# Patient Record
Sex: Female | Born: 1950 | Race: White | Hispanic: No | Marital: Married | Smoking: Former smoker
Health system: Southern US, Academic
[De-identification: ages and names within clinical notes are randomized; demographics above are authoritative.]

## PROBLEM LIST (undated history)

## (undated) DIAGNOSIS — K219 Gastro-esophageal reflux disease without esophagitis: Secondary | ICD-10-CM

## (undated) HISTORY — DX: Gastro-esophageal reflux disease without esophagitis: K21.9

## (undated) HISTORY — PX: HX HYSTERECTOMY: SHX81

## (undated) HISTORY — PX: PARTIAL GASTRECTOMY: SHX2172

---

## 1982-10-29 ENCOUNTER — Other Ambulatory Visit (HOSPITAL_COMMUNITY): Payer: Self-pay | Admitting: INTERNAL MEDICINE

## 2005-07-05 DIAGNOSIS — J8409 Other alveolar and parieto-alveolar conditions: Secondary | ICD-10-CM | POA: Insufficient documentation

## 2005-07-05 HISTORY — DX: Other alveolar and parieto-alveolar conditions: J84.09

## 2015-05-29 DIAGNOSIS — F32A Depression, unspecified: Secondary | ICD-10-CM | POA: Insufficient documentation

## 2015-05-29 DIAGNOSIS — IMO0002 Reserved for concepts with insufficient information to code with codable children: Secondary | ICD-10-CM

## 2015-05-29 DIAGNOSIS — G894 Chronic pain syndrome: Secondary | ICD-10-CM | POA: Insufficient documentation

## 2015-05-29 DIAGNOSIS — I10 Essential (primary) hypertension: Secondary | ICD-10-CM

## 2015-05-29 DIAGNOSIS — F411 Generalized anxiety disorder: Secondary | ICD-10-CM | POA: Insufficient documentation

## 2015-05-29 DIAGNOSIS — K219 Gastro-esophageal reflux disease without esophagitis: Secondary | ICD-10-CM

## 2015-05-29 DIAGNOSIS — M810 Age-related osteoporosis without current pathological fracture: Secondary | ICD-10-CM | POA: Insufficient documentation

## 2015-05-29 HISTORY — DX: Hypocalcemia: E83.51

## 2015-05-29 HISTORY — DX: Essential (primary) hypertension: I10

## 2015-05-29 HISTORY — DX: Generalized anxiety disorder: F41.1

## 2015-05-29 HISTORY — DX: Gastro-esophageal reflux disease without esophagitis: K21.9

## 2015-05-29 HISTORY — DX: Depression, unspecified: F32.A

## 2015-05-29 HISTORY — DX: Chronic pain syndrome: G89.4

## 2015-05-29 HISTORY — DX: Age-related osteoporosis without current pathological fracture: M81.0

## 2015-05-29 HISTORY — DX: Reserved for concepts with insufficient information to code with codable children: IMO0002

## 2015-09-18 DIAGNOSIS — J441 Chronic obstructive pulmonary disease with (acute) exacerbation: Secondary | ICD-10-CM | POA: Insufficient documentation

## 2015-09-18 DIAGNOSIS — J449 Chronic obstructive pulmonary disease, unspecified: Secondary | ICD-10-CM | POA: Insufficient documentation

## 2015-09-18 HISTORY — DX: Chronic obstructive pulmonary disease with (acute) exacerbation: J44.1

## 2016-11-25 DIAGNOSIS — E162 Hypoglycemia, unspecified: Secondary | ICD-10-CM

## 2016-11-25 HISTORY — DX: Hypoglycemia, unspecified: E16.2

## 2018-06-29 DIAGNOSIS — Z79891 Long term (current) use of opiate analgesic: Secondary | ICD-10-CM | POA: Insufficient documentation

## 2018-06-29 DIAGNOSIS — M62830 Muscle spasm of back: Secondary | ICD-10-CM | POA: Insufficient documentation

## 2018-06-29 DIAGNOSIS — M47814 Spondylosis without myelopathy or radiculopathy, thoracic region: Secondary | ICD-10-CM | POA: Insufficient documentation

## 2018-06-29 DIAGNOSIS — M792 Neuralgia and neuritis, unspecified: Secondary | ICD-10-CM

## 2018-06-29 DIAGNOSIS — M7918 Myalgia, other site: Secondary | ICD-10-CM | POA: Insufficient documentation

## 2018-06-29 HISTORY — DX: Muscle spasm of back: M62.830

## 2018-06-29 HISTORY — DX: Neuralgia and neuritis, unspecified: M79.2

## 2018-06-29 HISTORY — DX: Spondylosis without myelopathy or radiculopathy, thoracic region: M47.814

## 2018-06-29 HISTORY — DX: Long term (current) use of opiate analgesic: Z79.891

## 2018-06-29 HISTORY — DX: Myalgia, other site: M79.18

## 2020-08-07 IMAGING — MR MRI BRAIN W/O CONTRAST
8 of 9 series · 36 of 48 positions shown · non-contrast
Comparison: None previous.

﻿EXAM:  MRI BRAIN W/O CONTRAST
INDICATION: 70-year-old with gait disturbance.  Dementia.  Hypertension.
TECHNIQUE: Axial, coronal and sagittal images without contrast including diffusion-weighted sequence, T2* gradient echo sequence, FLAIR sequence and T1 and T2 sequences.

[Series 5: DWI · axial · 5.0mm · 1.35mm/px · z∈[-43,+77]mm · 10 of 88 slices shown (1 of 3)]
[im 6/88]
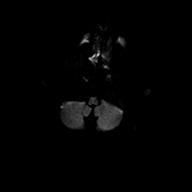
[im 12/88]
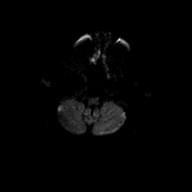
[im 18/88]
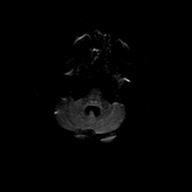
[im 30/88]
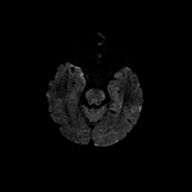
[im 41/88]
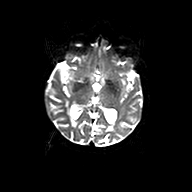
[im 47/88]
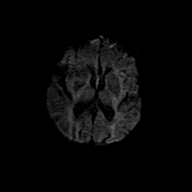
[im 53/88]
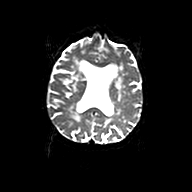
[im 64/88]
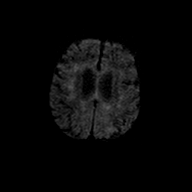
[im 76/88]
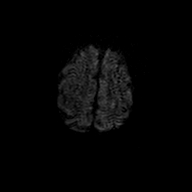
[im 88/88]
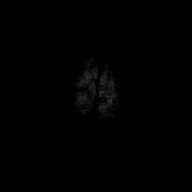

[Series 6: DWI · axial · 5.0mm · 1.35mm/px · z∈[-49,+77]mm · 4 of 22 slices shown (2 of 3)]
[im 1/22]
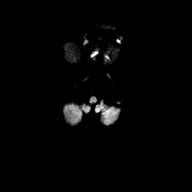
[im 8/22]
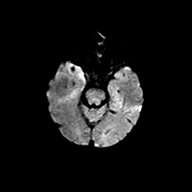
[im 15/22]
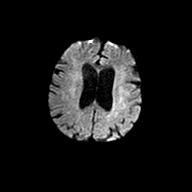
[im 22/22]
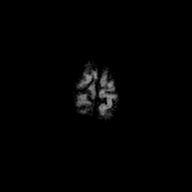

[Series 7: DWI · axial · 5.0mm · 1.35mm/px · z∈[-49,+77]mm · 4 of 22 slices shown (3 of 3)]
[im 1/22]
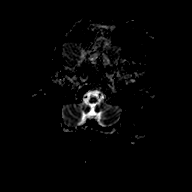
[im 8/22]
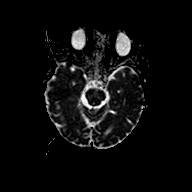
[im 15/22]
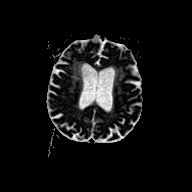
[im 22/22]
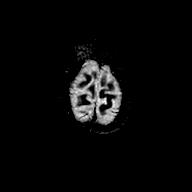

[Series 8: FLAIR · sagittal · 4.0mm · 0.75mm/px · 4 of 26 slices shown (1 of 2)]
[im 1/26]
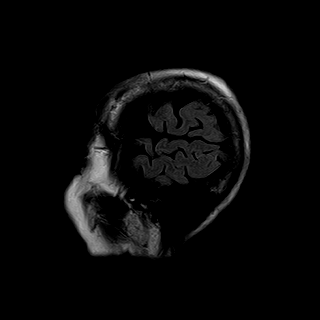
[im 9/26]
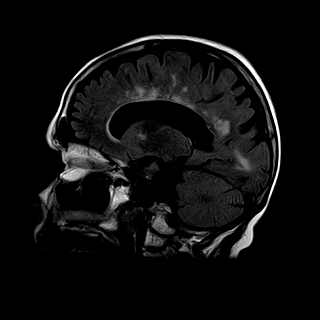
[im 17/26]
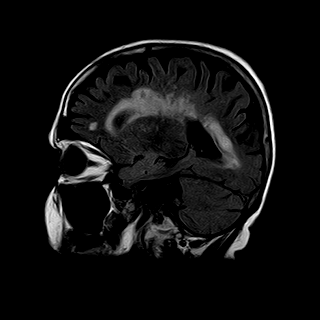
[im 26/26]
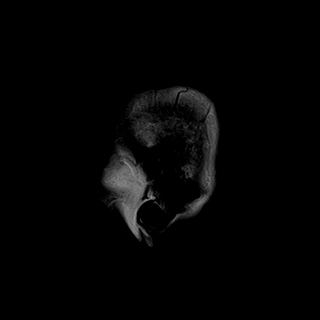

[Series 9: T2 · axial · 5.0mm · 0.43mm/px · z∈[-50,+106]mm · 4 of 27 slices shown (1 of 2)]
[im 1/27]
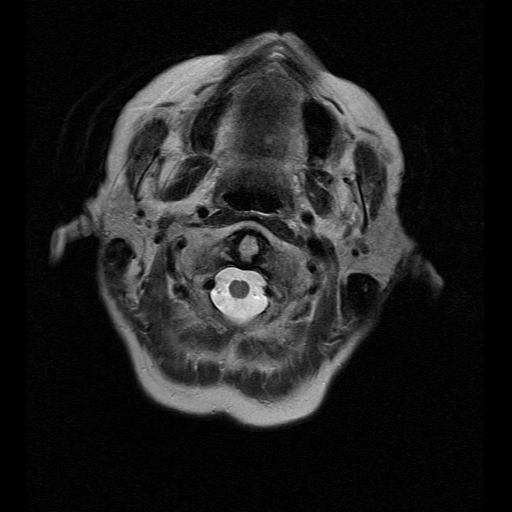
[im 9/27]
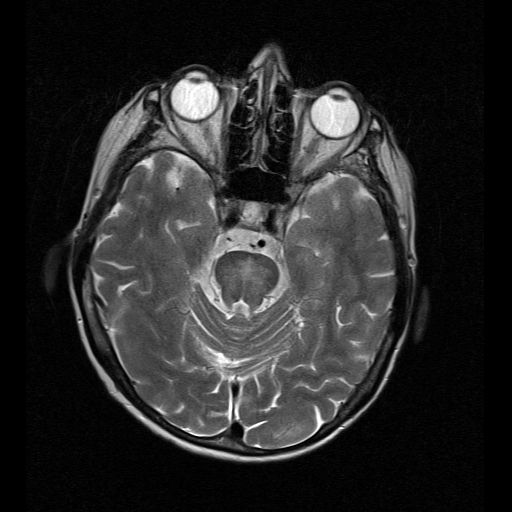
[im 18/27]
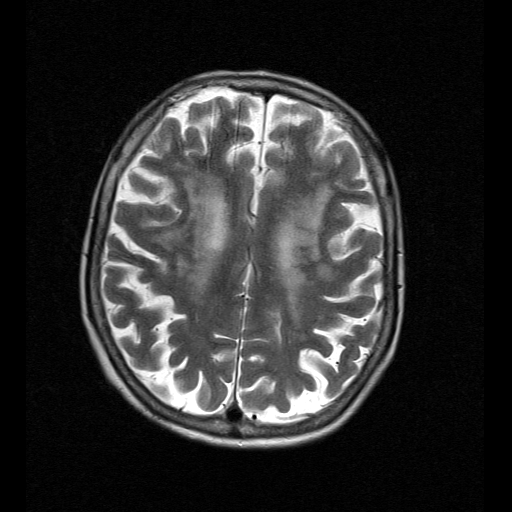
[im 27/27]
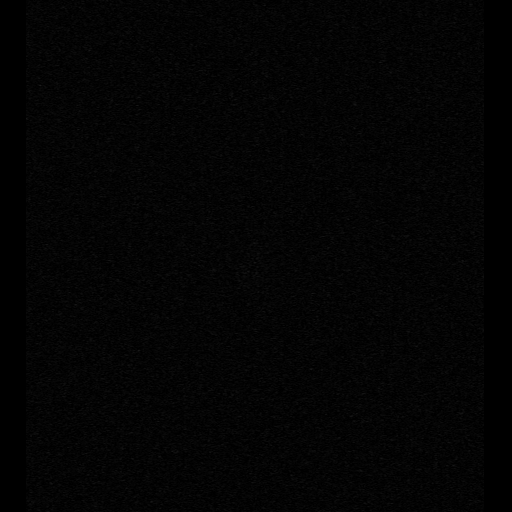

[Series 10: FLAIR · axial · 5.0mm · 0.43mm/px · z∈[-50,+106]mm · 4 of 27 slices shown (2 of 2)]
[im 1/27]
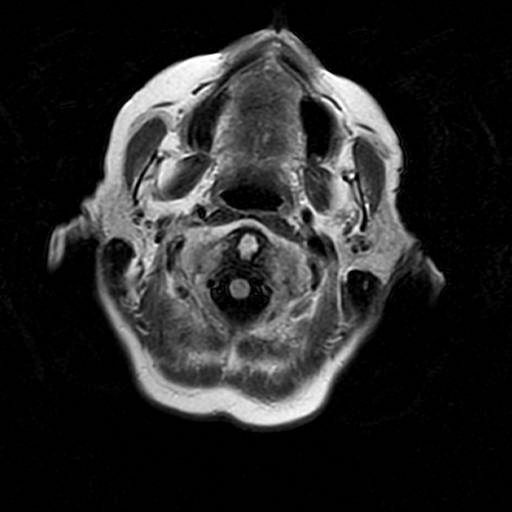
[im 9/27]
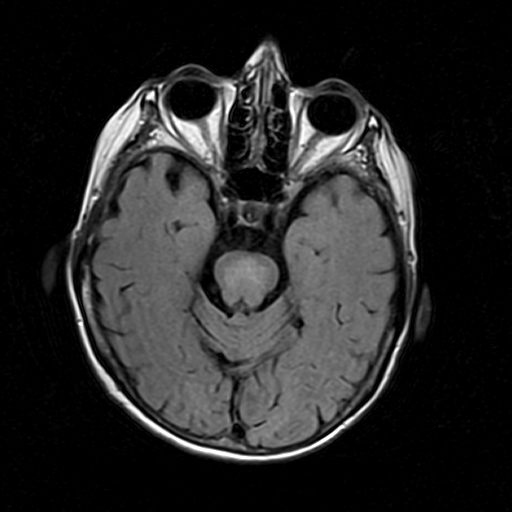
[im 18/27]
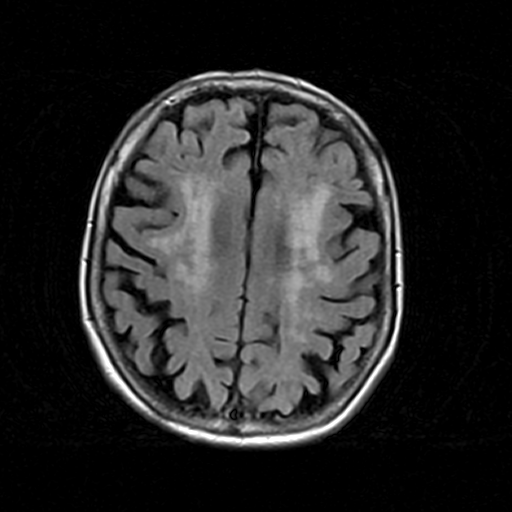
[im 27/27]
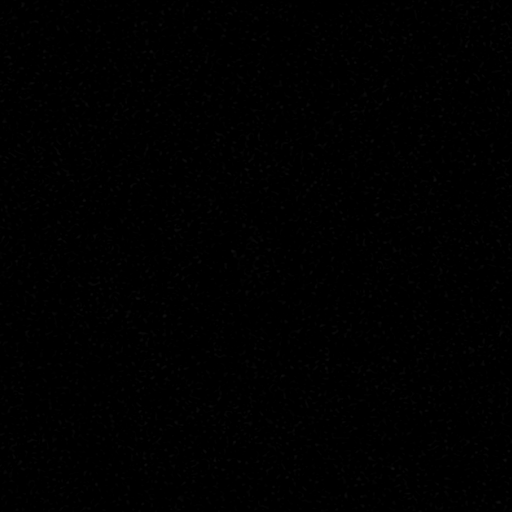

[Series 12: T1 · axial · 5.0mm · 0.43mm/px · z∈[-50,-2]mm · 2 of 27 slices shown]
[im 1/27]
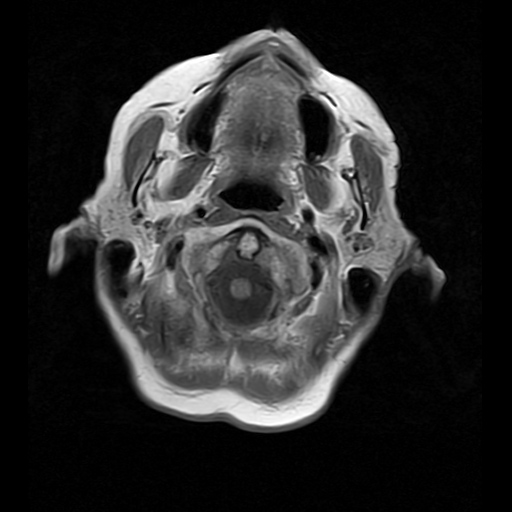
[im 9/27]
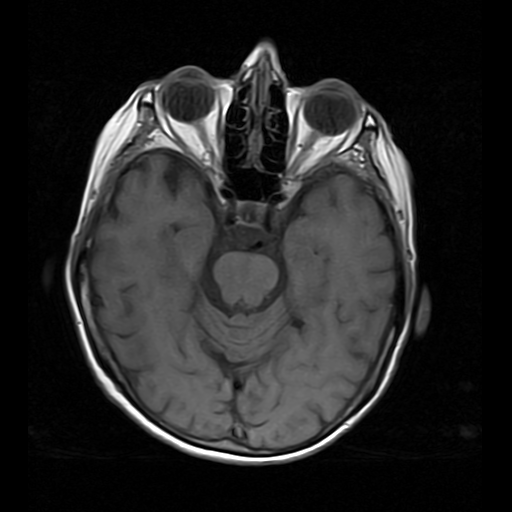

[Series 13: T2 · coronal · 6.0mm · 0.43mm/px · 4 of 24 slices shown (2 of 2)]
[im 1/24]
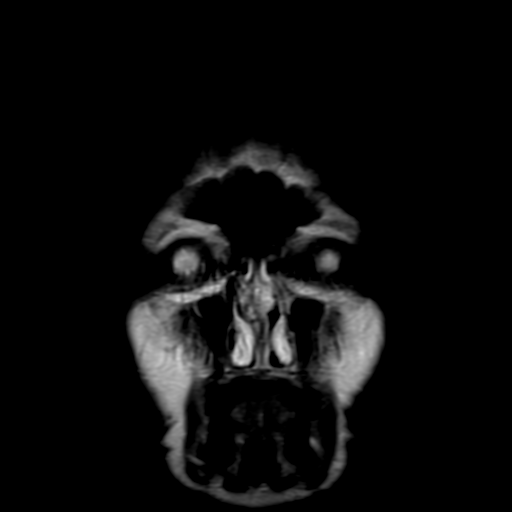
[im 8/24]
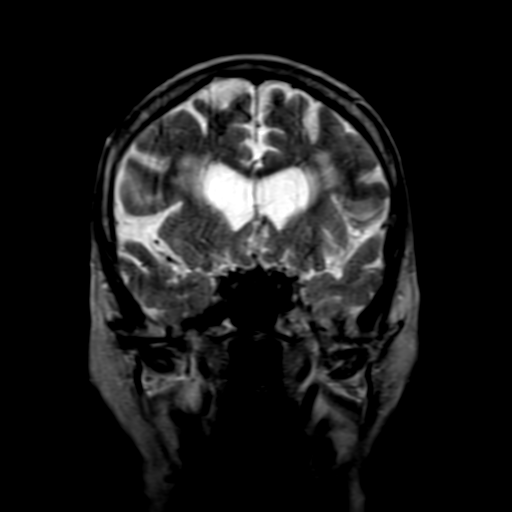
[im 16/24]
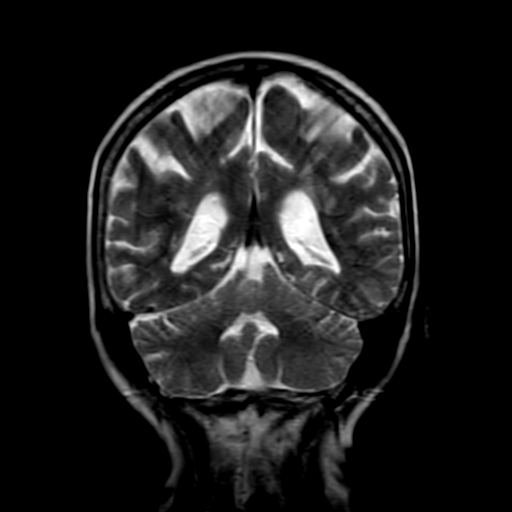
[im 24/24]
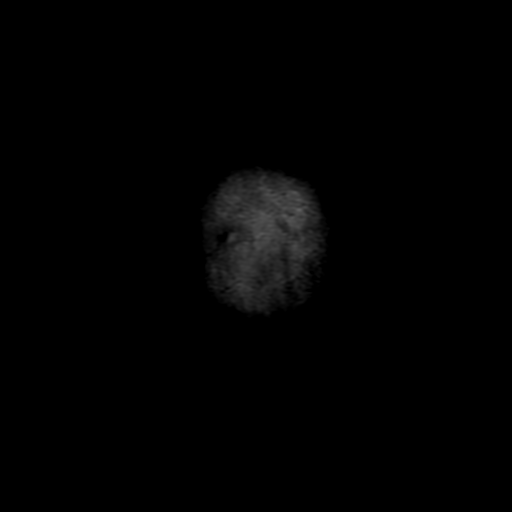

[36 of 48 positions shown; findings below may reference images not displayed]

FINDINGS: No evidence of acute ischemic process on the diffusion sequence.  Chronic small vessel ischemic change of periventricular white matter on both sides with increased FLAIR signal are noted.  Moderate, symmetric global cerebral atrophy is noted.  No ventriculomegaly or midline shift.  

Major arteries of circle of Willis are patent.  Dural venous sinuses are patent.
IMPRESSION: 1. No evidence of acute ischemia or intracranial bleed.  

2. No space occupying lesions, ventriculomegaly or midline shift.  

3. Significant chronic small vessel ischemic change of periventricular white matter.  Moderate symmetric global cerebral cortical atrophy.  

4. Major arteries of circle of Willis and dural venous sinuses are patent.

## 2021-02-01 DIAGNOSIS — S72001A Fracture of unspecified part of neck of right femur, initial encounter for closed fracture: Secondary | ICD-10-CM | POA: Insufficient documentation

## 2021-02-01 HISTORY — DX: Fracture of unspecified part of neck of right femur, initial encounter for closed fracture: S72.001A

## 2021-02-07 DIAGNOSIS — D62 Acute posthemorrhagic anemia: Secondary | ICD-10-CM | POA: Insufficient documentation

## 2021-02-07 DIAGNOSIS — Z5189 Encounter for other specified aftercare: Secondary | ICD-10-CM | POA: Insufficient documentation

## 2021-02-07 DIAGNOSIS — D500 Iron deficiency anemia secondary to blood loss (chronic): Secondary | ICD-10-CM | POA: Insufficient documentation

## 2021-02-08 DIAGNOSIS — G2581 Restless legs syndrome: Secondary | ICD-10-CM | POA: Insufficient documentation

## 2021-06-22 ENCOUNTER — Other Ambulatory Visit: Payer: Medicare Other | Attending: PHYSICIAN/UNDEFINED PHYSICIAN TYPE | Admitting: PHYSICIAN/UNDEFINED PHYSICIAN TYPE

## 2021-06-22 DIAGNOSIS — L820 Inflamed seborrheic keratosis: Secondary | ICD-10-CM | POA: Insufficient documentation

## 2021-06-22 DIAGNOSIS — C44712 Basal cell carcinoma of skin of right lower limb, including hip: Secondary | ICD-10-CM | POA: Insufficient documentation

## 2021-06-24 DIAGNOSIS — C44712 Basal cell carcinoma of skin of right lower limb, including hip: Secondary | ICD-10-CM

## 2021-06-24 DIAGNOSIS — L820 Inflamed seborrheic keratosis: Secondary | ICD-10-CM

## 2021-06-24 LAB — SURGICAL PATHOLOGY SPECIMEN

## 2021-07-06 ENCOUNTER — Other Ambulatory Visit (HOSPITAL_COMMUNITY): Payer: Self-pay | Admitting: PHYSICIAN/UNDEFINED PHYSICIAN TYPE

## 2021-07-06 ENCOUNTER — Inpatient Hospital Stay
Admission: RE | Admit: 2021-07-06 | Discharge: 2021-07-06 | Disposition: A | Discharge status: Home / Self Care | Payer: Medicare Other | Source: Ambulatory Visit | Attending: PHYSICIAN/UNDEFINED PHYSICIAN TYPE | Admitting: PHYSICIAN/UNDEFINED PHYSICIAN TYPE

## 2021-07-06 ENCOUNTER — Other Ambulatory Visit: Payer: Self-pay

## 2021-07-06 DIAGNOSIS — M25551 Pain in right hip: Secondary | ICD-10-CM | POA: Insufficient documentation

## 2021-08-03 ENCOUNTER — Encounter (INDEPENDENT_AMBULATORY_CARE_PROVIDER_SITE_OTHER): Payer: Self-pay | Admitting: OTOLARYNGOLOGY

## 2021-08-03 ENCOUNTER — Other Ambulatory Visit: Payer: Self-pay

## 2021-08-03 ENCOUNTER — Ambulatory Visit (INDEPENDENT_AMBULATORY_CARE_PROVIDER_SITE_OTHER): Payer: Medicare Other | Admitting: OTOLARYNGOLOGY

## 2021-08-03 VITALS — Ht <= 58 in | Wt 104.0 lb

## 2021-08-03 DIAGNOSIS — J471 Bronchiectasis with (acute) exacerbation: Secondary | ICD-10-CM | POA: Insufficient documentation

## 2021-08-03 DIAGNOSIS — I671 Cerebral aneurysm, nonruptured: Secondary | ICD-10-CM | POA: Insufficient documentation

## 2021-08-03 DIAGNOSIS — H9190 Unspecified hearing loss, unspecified ear: Secondary | ICD-10-CM

## 2021-08-03 DIAGNOSIS — E871 Hypo-osmolality and hyponatremia: Secondary | ICD-10-CM

## 2021-08-03 DIAGNOSIS — F1190 Opioid use, unspecified, uncomplicated: Secondary | ICD-10-CM | POA: Insufficient documentation

## 2021-08-03 DIAGNOSIS — L989 Disorder of the skin and subcutaneous tissue, unspecified: Secondary | ICD-10-CM

## 2021-08-03 DIAGNOSIS — R479 Unspecified speech disturbances: Secondary | ICD-10-CM | POA: Insufficient documentation

## 2021-08-03 HISTORY — DX: Bronchiectasis with (acute) exacerbation: J47.1

## 2021-08-03 HISTORY — DX: Opioid use, unspecified, uncomplicated: F11.90

## 2021-08-03 HISTORY — DX: Unspecified speech disturbances: R47.9

## 2021-08-03 HISTORY — DX: Hypo-osmolality and hyponatremia: E87.1

## 2021-08-03 HISTORY — DX: Cerebral aneurysm, nonruptured: I67.1

## 2021-08-03 NOTE — H&P (Signed)
ENT, [place]  [place] [address]    History and Physical     Name: Laura Oconnell MRN:  [id number]   Date: 08/03/2021 Age: 71 y.o.          New Patient      Chief Complaint:    Chief Complaint   Patient presents with   . Skin Check     Complains of skin lesions on forehead. Present for about 5 years.       HPI:  Laura Oconnell is a 71 y.o. female presenting for a new patient visit. Patient states that she has had 2 forehead lesions x 5 years. They are painful and itch at times.  She is unsure if she has had any skin cancers in the past.  Patient also states that she has had hearing loss for several years and appears to be getting worse.     History reviewed. No pertinent past medical history.  Past Medical History was reviewed and is negative for ear surgery    Past Surgical History was reviewed and is negative for ear surgery      Family Medical History:    None         Social History     Tobacco Use   . Smoking status: Never   . Smokeless tobacco: Never        Medications:  Current Outpatient Medications   Medication Sig   . baclofen (LIORESAL) 10 mg Oral Tablet Take 1 Tablet (10 mg total) by mouth   . baclofen (LIORESAL) 5 mg Oral Tablet Take 1 Tablet (5 mg total) by mouth   . Calcium-Cholecalciferol, D3, 600 mg-5 mcg (200 unit) Oral Capsule Take 1 Capsule by mouth Once a day   . Diltiazem HCl (TIAZAC) 120 mg Oral Capsule,Sustained Action 24 hr Take 1 Capsule (120 mg total) by mouth Once a day   . folic acid (FOLVITE) 1 mg Oral Tablet Take 1 Tablet (1 mg total) by mouth Once a day   . gabapentin (NEURONTIN) 800 mg Oral Tablet Take 1 Tablet (800 mg total) by mouth Three times a day   . HYDROcodone-acetaminophen (NORCO) 5-325 mg Oral Tablet Take 1 Tablet by mouth Every 6 hours as needed   . memantine (NAMENDA) 10 mg Oral Tablet 1 Tablet (10 mg total)   . omeprazole (PRILOSEC) 20 mg Oral Capsule, Delayed Release(E.C.) Take 1 Capsule (20 mg total) by mouth   . oxyCODONE (OXYCONTIN) 10  mg Oral tablet,oral only,ext.rel.12 hr Take 1 Tablet (10 mg total) by mouth Every 12 hours   . PARoxetine (PAXIL) 40 mg Oral Tablet Take 1 Tablet (40 mg total) by mouth Every morning       Allergies:  No Known Allergies    Review of Systems:  Review of Systems     Physical Exam:  Vitals:    08/03/21 1410   Weight: 47.2 kg (104 lb)   Height: 1.473 m ('4\' 10"'$ )   BMI: 21.78      ENT Physical Exam  Constitutional  Appearance: patient appears well-developed, well-nourished and well-groomed,  Communication/Voice: communication appropriate for developmental age; vocal quality normal;  Head and Face  Appearance: face appears normal and face appears atraumatic; head lesions present;  Palpation: facial palpation normal;  Salivary: glands normal;  Ear  Hearing: intact;  Auricles: right auricle normal; left auricle normal;  External Mastoids: right external mastoid normal; left external mastoid normal;  Ear Canals: right ear canal normal;  left ear canal normal;  Tympanic Membranes: right tympanic membrane normal; left tympanic membrane normal;  Nose  External Nose: nares patent bilaterally; external nose normal;  Internal Nose: nasal mucosa normal; septum normal; bilateral inferior turbinates normal;  Oral Cavity/Oropharynx  Lips: normal;  Teeth: normal;  Gums: gingiva normal;  Tongue: normal;  Oral mucosa: normal;  Hard palate: normal;  Neck  Neck: neck normal; neck palpation normal;  Thyroid: thyroid normal;  Respiratory  Inspection: breathing unlabored; normal breathing rate;  Lymphatic  Palpation: lymph nodes normal;  Neurovestibular  Mental Status: alert and oriented;  Psychiatric: mood normal; affect is appropriate;  Cranial Nerves: cranial nerves intact;               Assessment and Plan:    ICD-10-CM    1. Hearing loss, unspecified hearing loss type, unspecified laterality  H91.90 Referral to [facility]      2. Facial lesion  L98.9 11312 - SHAVE EPI/DERMAL LESION 1.0-2.0CM,FACE,EARS,ETC (AMB  ONLY)     11313 - SHAVE EPI/DERMAL LESION >2.0CM,FACE,EARS,ETC (AMB ONLY)        Orders Placed This Encounter   . 68032 - SHAVE EPI/DERMAL LESION 1.0-2.0CM,FACE,EARS,ETC (AMB ONLY)   . 11313 - SHAVE EPI/DERMAL LESION >2.0CM,FACE,EARS,ETC (AMB ONLY)   . Referral to [facility]        Follow Up:  Return in about 2 weeks (around 08/17/2021).     Dia Sitter, DO

## 2021-08-03 NOTE — Procedures (Signed)
ENT, [place]  [place] [address]    Procedure Note    Name: Laura Oconnell MRN:  [id number]   Date: 08/03/2021 Age: 71 y.o.       11312 - SHAVE EPI/DERMAL LESION 1.0-2.0CM,FACE,EARS,ETC (AMB ONLY)  Performed by: Dia Sitter, DO  Authorized by: Dia Sitter, DO     Time Out:     Immediately before the procedure, a time out was called:  Yes    Patient verified:  Yes    Procedure Verified:  Yes    Site Verified:  Yes  Documentation:      Shave excision performed with flexible razor under local using 1 % lidocaine with epi.  Hemostasis achieved with Drysol.  Patient tolerated procedure without complication.  Specimen was sent to pathology. Right forehead 2 cm  11313 - SHAVE EPI/DERMAL LESION >2.0CM,FACE,EARS,ETC (AMB ONLY)  Performed by: Dia Sitter, DO  Authorized by: Dia Sitter, DO     Time Out:     Immediately before the procedure, a time out was called:  Yes    Patient verified:  Yes    Procedure Verified:  Yes    Site Verified:  Yes  Documentation:      Shave excision performed with flexible razor under local using 1 % lidocaine with epi.  Hemostasis achieved with Drysol.  Patient tolerated procedure without complication.  Specimen was sent to pathology. Left forehead 2.5 cm        Dia Sitter, DO

## 2021-08-19 ENCOUNTER — Ambulatory Visit (INDEPENDENT_AMBULATORY_CARE_PROVIDER_SITE_OTHER): Payer: Medicare Other | Admitting: OTOLARYNGOLOGY

## 2021-08-19 ENCOUNTER — Other Ambulatory Visit: Payer: Self-pay

## 2021-08-19 ENCOUNTER — Encounter (INDEPENDENT_AMBULATORY_CARE_PROVIDER_SITE_OTHER): Payer: Self-pay | Admitting: OTOLARYNGOLOGY

## 2021-08-19 VITALS — Ht <= 58 in | Wt 104.0 lb

## 2021-08-19 DIAGNOSIS — L570 Actinic keratosis: Secondary | ICD-10-CM

## 2021-08-19 DIAGNOSIS — D0439 Carcinoma in situ of skin of other parts of face: Secondary | ICD-10-CM

## 2021-08-19 NOTE — H&P (Signed)
[facility]  ENT, [place]    Progress Note    Name: Laura Oconnell MRN:  [id number]   Date: 08/19/2021 Age: 71 y.o.          Follow Up      Subjective:   Chief Complaint:   Follow-up After Testing (Rc after biopsies)       History of Present Illness:  Laura Oconnell is a 71 y.o. old female who presents to the clinic for follow-up after biopsy . Results are below.            Review of Systems     Physical Exam:     Vitals:    08/19/21 1554   Weight: 47.2 kg (104 lb)   Height: 1.473 m ('4\' 10"'$ )   BMI: 21.78      ENT Physical Exam               Assessment and Plan:       ICD-10-CM    1. 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 Actinic keratosis of forehead  L57.0         Treatment options discussed with patient and will proceed with mohs and facial plastics reconsruction.      Follow up:  Return for Follow up after mohs.    Dia Sitter, DO

## 2021-08-23 ENCOUNTER — Other Ambulatory Visit (INDEPENDENT_AMBULATORY_CARE_PROVIDER_SITE_OTHER): Payer: Self-pay | Admitting: OTOLARYNGOLOGY

## 2021-08-23 DIAGNOSIS — Z7901 Long term (current) use of anticoagulants: Secondary | ICD-10-CM

## 2021-08-23 DIAGNOSIS — Z8679 Personal history of other diseases of the circulatory system: Secondary | ICD-10-CM

## 2021-08-23 DIAGNOSIS — D0439 Carcinoma in situ of skin of other parts of face: Secondary | ICD-10-CM

## 2021-08-23 DIAGNOSIS — Z01818 Encounter for other preprocedural examination: Secondary | ICD-10-CM

## 2021-08-24 NOTE — Addendum Note (Signed)
Addended by: Jules Husbands on: 08/24/2021 12:31 PM     Modules accepted: Orders

## 2021-09-01 ENCOUNTER — Other Ambulatory Visit: Payer: Self-pay

## 2021-09-01 ENCOUNTER — Encounter (INDEPENDENT_AMBULATORY_CARE_PROVIDER_SITE_OTHER): Payer: Self-pay | Admitting: Family

## 2021-09-01 ENCOUNTER — Inpatient Hospital Stay (HOSPITAL_BASED_OUTPATIENT_CLINIC_OR_DEPARTMENT_OTHER)
Admission: RE | Admit: 2021-09-01 | Discharge: 2021-09-01 | Disposition: A | Discharge status: Home / Self Care | Payer: Medicare Other | Source: Ambulatory Visit | Attending: Family | Admitting: Family

## 2021-09-01 ENCOUNTER — Other Ambulatory Visit: Payer: Medicare Other | Attending: OTOLARYNGOLOGY

## 2021-09-01 ENCOUNTER — Ambulatory Visit (INDEPENDENT_AMBULATORY_CARE_PROVIDER_SITE_OTHER): Payer: Medicare Other | Admitting: Family

## 2021-09-01 VITALS — BP 156/86 | HR 89 | Temp 97.8°F | Resp 20 | Ht <= 58 in | Wt 111.0 lb

## 2021-09-01 DIAGNOSIS — J449 Chronic obstructive pulmonary disease, unspecified: Secondary | ICD-10-CM

## 2021-09-01 DIAGNOSIS — Z7901 Long term (current) use of anticoagulants: Secondary | ICD-10-CM | POA: Insufficient documentation

## 2021-09-01 DIAGNOSIS — Z01818 Encounter for other preprocedural examination: Secondary | ICD-10-CM

## 2021-09-01 DIAGNOSIS — C4490 Unspecified malignant neoplasm of skin, unspecified: Secondary | ICD-10-CM

## 2021-09-01 DIAGNOSIS — D0439 Carcinoma in situ of skin of other parts of face: Secondary | ICD-10-CM

## 2021-09-01 DIAGNOSIS — Z8679 Personal history of other diseases of the circulatory system: Secondary | ICD-10-CM

## 2021-09-01 LAB — CBC WITH DIFF
BASOPHIL #: 0 10*3/uL (ref 0.00–0.30)
BASOPHIL %: 1 % (ref 0–3)
EOSINOPHIL #: 0 10*3/uL (ref 0.00–0.80)
EOSINOPHIL %: 1 % (ref 0–7)
HCT: 37.5 % (ref 37.0–47.0)
HGB: 12.5 g/dL (ref 12.5–16.0)
LYMPHOCYTE #: 1 10*3/uL — ABNORMAL LOW (ref 1.10–5.00)
LYMPHOCYTE %: 27 % (ref 25–45)
MCH: 29.1 pg (ref 27.0–32.0)
MCHC: 33.5 g/dL (ref 32.0–36.0)
MCV: 87.1 fL (ref 78.0–99.0)
MONOCYTE #: 0.3 10*3/uL (ref 0.00–1.30)
MONOCYTE %: 9 % (ref 0–12)
MPV: 6.9 fL — ABNORMAL LOW (ref 7.4–10.4)
NEUTROPHIL #: 2.3 10*3/uL (ref 1.80–8.40)
NEUTROPHIL %: 62 % (ref 40–76)
PLATELETS: 105 10*3/uL — ABNORMAL LOW (ref 140–440)
RBC: 4.3 10*6/uL (ref 4.20–5.40)
RDW: 14.5 % (ref 11.6–14.8)
WBC: 3.7 10*3/uL — ABNORMAL LOW (ref 4.0–10.5)
WBCS UNCORRECTED: 3.7 10*3/uL

## 2021-09-01 LAB — BASIC METABOLIC PANEL
ANION GAP: 3 mmol/L — ABNORMAL LOW (ref 10–20)
BUN/CREA RATIO: 28 — ABNORMAL HIGH (ref 6–22)
BUN: 27 mg/dL — ABNORMAL HIGH (ref 7–25)
CALCIUM: 9.4 mg/dL (ref 8.6–10.3)
CHLORIDE: 105 mmol/L (ref 98–107)
CO2 TOTAL: 30 mmol/L (ref 21–31)
CREATININE: 0.95 mg/dL (ref 0.60–1.30)
ESTIMATED GFR: 64 mL/min/{1.73_m2} (ref >59)
GLUCOSE: 114 mg/dL — ABNORMAL HIGH (ref 74–109)
OSMOLALITY, CALCULATED: 282 mOsm/kg (ref 270–290)
POTASSIUM: 5.5 mmol/L — ABNORMAL HIGH (ref 3.5–5.1)
SODIUM: 138 mmol/L (ref 136–145)

## 2021-09-01 LAB — PT/INR
INR: 0.95 (ref <=5.00)
PROTHROMBIN TIME: 11 seconds (ref 9.8–12.7)

## 2021-09-01 LAB — PTT (PARTIAL THROMBOPLASTIN TIME): APTT: 34.2 seconds (ref 26.0–36.0)

## 2021-09-01 NOTE — Progress Notes (Signed)
[address]  [place]con[place] [address]  Dept: [phone]  Dept Fax: [phone]     Laura Oconnell  01-23-1951  [id number]    Date of Service: 09/01/2021  8:30 AM EDT    Chief complaint:   Chief Complaint   Patient presents with   . Pre-op Evaluation     Skin lesion excision left side of forehead by Dr. Theodis Sato 09/29/2021 reconstruction with Dr. Cyndia Diver on 10/01/2021.     Dr. Garlon Hatchet PCP.        Subjective:     Laura Oconnell is a 71 y.o. female is here today for preoperative evaluation for skin lesion excision to left forehead with reconstruction surgery planned on 09/29/2021 (excision) and 10/01/2021 (reconstruction).  She is referred by Dr Theodis Sato and Dr Cyndia Diver.      Patient has no cardiac or pulmonary history that would preclude them from this surgery.  Patient has had surgery before and had no issues with anesthesia.  Patient is capable of performing  daily life tasks such as : Light housework (such as dusting, washing dishes), Climb a flight of stairs or Walking on level ground at 15 minutes per mile     Has anyone in your family (blood relatives) had a problem following an anaesthetic? No  Do you have any bleeding problems?  No  Do you have kidney disease?  No  Have you ever had a heart attack?  No  Have you ever been diagnosed with an irregular heartbeat?  No  Have you ever had a stroke?  No  Do you have significant neck arthritis?  No  Do you have liver disease?  No  Have you ever been diagnosed with heart failure?  No  Do you suffer from asthma?  No  Do you have diabetes that requires insulin?  No    Past Medical History:   Diagnosis Date   . Bronchiectasis with (acute) exacerbation (CMS HCC) 08/03/2021   . Chronic obstructive pulmonary disease with acute exacerbation (CMS HCC) 09/18/2015   . Chronic pain disorder 05/29/2015   . Chronic, continuous use of opioids 08/03/2021   . Closed right hip fracture, initial encounter (CMS [facility]) 02/01/2021   . Compression fracture 05/29/2015    Formatting of this  note might be different from the original. Per Dr. Merleen Nicely, Neurosurgery; kyphoplasty x 2   . Depression 05/29/2015   . Difficulty with speech 08/03/2021   . Esophageal reflux    . Essential hypertension 05/29/2015   . GAD (generalized anxiety disorder) 05/29/2015   . Gastroesophageal reflux disease without esophagitis 05/29/2015   . Hypocalcemia 05/29/2015   . Hypoglycemia 11/25/2016   . Hyponatremia 08/03/2021   . Intracranial aneurysm 08/03/2021   . Myofascial pain syndrome 06/29/2018   . Neuropathic pain 06/29/2018   . Opioid use agreement exists 06/29/2018   . Osteoporosis 05/29/2015   . 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   . Spasm of back muscles 06/29/2018   . 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      baclofen (LIORESAL) 10 mg Oral Tablet, Take 1 Tablet (10 mg total) by mouth  baclofen (LIORESAL) 5 mg Oral Tablet, Take 1 Tablet (5 mg total) by mouth Three times a day  Calcium-Cholecalciferol, D3, 600 mg-5 mcg (200 unit) Oral Capsule, Take 1 Capsule  by mouth Once a day  COPPER ORAL, Take 2 mg by mouth Once a day  Diltiazem HCl (TIAZAC) 120 mg Oral Capsule,Sustained Action 24 hr, Take 1 Capsule (120 mg total) by mouth Once a day  ferrous sulfate (FERATAB) 324 mg (65 mg iron) Oral Tablet, Delayed Release (E.C.), Take 1 Tablet (324 mg total) by mouth Once a day  folic acid (FOLVITE) 1 mg Oral Tablet, Take 2 Tablets (2 mg total) by mouth Once a day  gabapentin (NEURONTIN) 800 mg Oral Tablet, Take 1 Tablet (800 mg total) by mouth Three times a day  HYDROcodone-acetaminophen (NORCO) 5-325 mg Oral Tablet, Take 1 Tablet by mouth Every 6 hours as needed  memantine (NAMENDA) 10 mg Oral Tablet, 1 Tablet (10 mg total) Twice daily  omeprazole (PRILOSEC) 20 mg Oral Capsule, Delayed Release(E.C.), Take 1 Capsule (20 mg total) by  mouth  oxyCODONE (OXYCONTIN) 10 mg Oral tablet,oral only,ext.rel.12 hr, Take 1 Tablet (10 mg total) by mouth Every 12 hours  PARoxetine (PAXIL) 40 mg Oral Tablet, Take 1 Tablet (40 mg total) by mouth Every morning  rOPINIRole (REQUIP) 0.5 mg Oral Tablet, Take 1 Tablet (0.5 mg total) by mouth Every night  tiotropium bromide (SPIRIVA HANDIHALER) 18 mcg Inhalation Capsule, w/Inhalation Device, Take 1 Capsule (18 mcg total) by inhalation Once a day    No facility-administered medications prior to visit.     OB History   No obstetric history on file.     Family Medical History:    None         Social History     Socioeconomic History   . Marital status: Married     Spouse name: Not on file   . Number of children: Not on file   . Years of education: Not on file   . Highest education level: Not on file   Occupational History   . Not on file   Tobacco Use   . Smoking status: Former     Packs/day: 2.00     Years: 42.00     Pack years: 84.00     Types: Cigarettes     Quit date: 2008     Years since quitting: 15.4   . Smokeless tobacco: Never   Vaping Use   . Vaping Use: Never used   Substance and Sexual Activity   . Alcohol use: Not on file   . Drug use: Never   . Sexual activity: Not on file   Other Topics Concern   . Not on file   Social History Narrative   . Not on file     Social Determinants of Health     Financial Resource Strain: Not on file   Transportation Needs: Not on file   Social Connections: Not on file   Intimate Partner Violence: Not on file   Housing Stability: Not on file        REVIEW OF SYSTEMS  Other than ROS in the HPI, all other systems were negative.    Objective:     Vitals: BP (!) 160/87 (Site: Left, Patient Position: Sitting, Cuff Size: Adult)   Pulse 89   Temp 36.6 C (97.8 F) (Temporal)   Resp 20   Ht 1.473 m ('4\' 10"'$ )   Wt 50.3 kg (111 lb)   SpO2 94%   BMI 23.20 kg/m       Appearance:   Exam: in no apparent distress, well developed and well nourished, non-toxic, in no respiratory distress  and acyanotic, alert,  oriented times 3, afebrile, normal vitals, cooperative, well hydrated     Physical Exam  Vitals reviewed.   Constitutional:       General: She is not in acute distress.     Appearance: Normal appearance. She is not ill-appearing, toxic-appearing or diaphoretic.   HENT:      Head: Normocephalic.      Right Ear: Tympanic membrane, ear canal and external ear normal.      Left Ear: Tympanic membrane, ear canal and external ear normal.      Nose: Nose normal. No rhinorrhea.      Mouth/Throat:      Mouth: Mucous membranes are moist.      Pharynx: No posterior oropharyngeal erythema.   Eyes:      Extraocular Movements: Extraocular movements intact.      Conjunctiva/sclera: Conjunctivae normal.      Pupils: Pupils are equal, round, and reactive to light.   Neck:      Thyroid: No thyroid mass, thyromegaly or thyroid tenderness.      Vascular: No carotid bruit.   Cardiovascular:      Rate and Rhythm: Normal rate and regular rhythm.      Pulses: Normal pulses.      Heart sounds: Normal heart sounds. No murmur heard.  Pulmonary:      Effort: Pulmonary effort is normal. No respiratory distress.      Breath sounds: Examination of the right-lower field reveals decreased breath sounds. Examination of the left-lower field reveals decreased breath sounds. Decreased breath sounds present. No wheezing, rhonchi or rales.   Abdominal:      General: Bowel sounds are normal. There is no distension.      Palpations: Abdomen is soft.      Tenderness: There is no abdominal tenderness.   Musculoskeletal:         General: Normal range of motion.      Cervical back: Normal range of motion.      Right lower leg: No edema.      Left lower leg: No edema.   Lymphadenopathy:      Cervical: No cervical adenopathy.   Skin:     General: Skin is warm and dry.      Coloration: Skin is not jaundiced.      Findings: No rash.   Neurological:      General: No focal deficit present.      Mental Status: She is alert and oriented to person,  place, and time.      Gait: Gait abnormal (ambulates with cane due to recent hip fracture).   Psychiatric:         Mood and Affect: Mood normal.         Behavior: Behavior normal.         Thought Content: Thought content normal.         Judgment: Judgment normal.       Assessment:     Assessment/Plan   1. Chronic obstructive pulmonary disease, unspecified COPD type (CMS HCC)          Plan:     Preoperative Consultation for skin lesion removal/reconstruction. Patient has mild  risk factors for major cardiac complications by the [facility] Perioperative Risk for Myocardial Infarction or Cardiac Arrest (MICA). This puts the patient at approximately 0.1% risk of a major cardiac complication during the planned procedure.  Patient has been informed of this risk, is willing to proceed.     Patient reports that she is scheduled  for cardiac clearance with Dr Marcille Blanco tomorrow at his office and therefore no EKG was ordered today.  Patient will complete labs as ordered by Dr Cyndia Diver and CXR as ordered by this provider today.  Once reviewed, clearance will be forwarded to [facility] and Dr Theodis Sato.     Follow up with regular PCP as scheduled or return to this office for any concerns.   Return if symptoms worsen or fail to improve.    Copy of this note will be forwarded to her referring physician.    Dan Humphreys, FNP-BC 09/01/2021, 09:12    PRE-OPERATIVE ASSESSMENT

## 2021-09-02 ENCOUNTER — Other Ambulatory Visit (INDEPENDENT_AMBULATORY_CARE_PROVIDER_SITE_OTHER): Payer: Self-pay | Admitting: Family

## 2021-09-02 DIAGNOSIS — J811 Chronic pulmonary edema: Secondary | ICD-10-CM

## 2021-09-02 DIAGNOSIS — J189 Pneumonia, unspecified organism: Secondary | ICD-10-CM

## 2021-09-02 DIAGNOSIS — R918 Other nonspecific abnormal finding of lung field: Secondary | ICD-10-CM

## 2021-09-02 DIAGNOSIS — J449 Chronic obstructive pulmonary disease, unspecified: Secondary | ICD-10-CM

## 2021-09-02 MED ORDER — AZITHROMYCIN 250 MG TABLET
ORAL_TABLET | ORAL | 0 refills | Status: DC
Start: 2021-09-02 — End: 2021-09-30

## 2021-09-02 MED ORDER — FUROSEMIDE 20 MG TABLET
20.0000 mg | ORAL_TABLET | Freq: Every day | ORAL | 1 refills | Status: DC
Start: 2021-09-02 — End: 2023-09-12

## 2021-09-02 NOTE — Result Encounter Note (Signed)
Please call patient and tell her that her CXR is suspicious for pulmonary hypertension as well as pneumonia.  I have sent patient in a prescription for Lasix 20 mg once daily to the pharmacy as well as a prescription for Zithromax 2 tablets today followed by 1 tablet daily for 4 days to her pharmacy.  Have patient to repeat her chest x-ray in 2 weeks to evaluate for improvement in these symptoms before surgical clearance can be given.  Please also fax a copy of this chest x-ray report to Dr. Marcille Blanco as patient was to see him yesterday.

## 2021-09-03 IMAGING — MR MRI LUMBAR SPINE WITHOUT CONTRAST
5 of 6 series · 31 of 48 positions shown · IV contrast (gadolinium)
Comparison: Radiographs dated 09/19/2013.

﻿EXAM:  06178   MRI LUMBAR SPINE WITHOUT CONTRAST
INDICATION: Chronic lower back pain.
TECHNIQUE: Multiplanar multisequential MRI of the lumbar spine was performed without gadolinium contrast.

[Series 8: T2 · sagittal · 4.0mm · 0.94mm/px · 6 of 13 slices shown (1 of 3)]
[im 1/13]
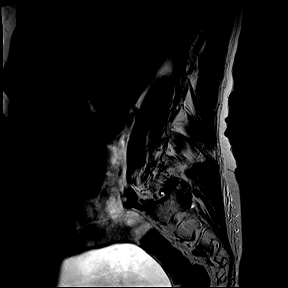
[im 3/13]
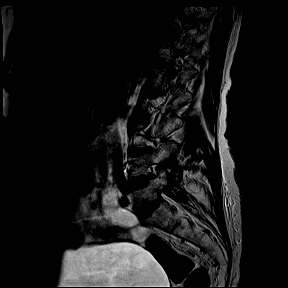
[im 5/13]
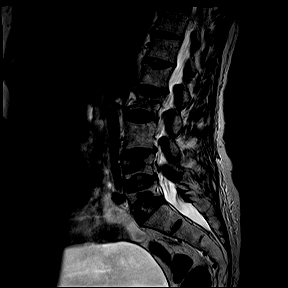
[im 8/13]
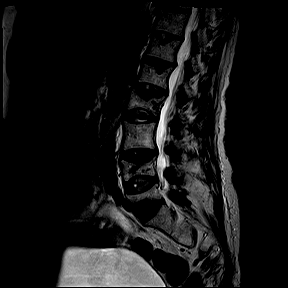
[im 10/13]
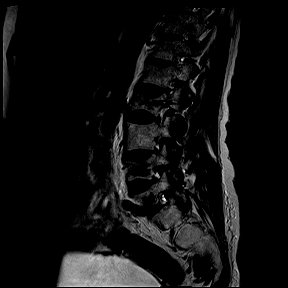
[im 13/13]
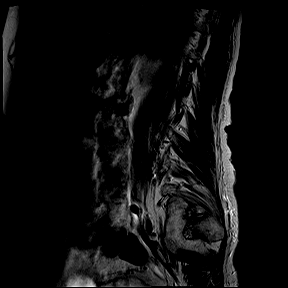

[Series 9: T1 · sagittal · 4.0mm · 0.94mm/px · 5 of 13 slices shown (1 of 2)]
[im 1/13]
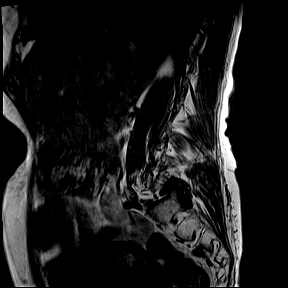
[im 4/13]
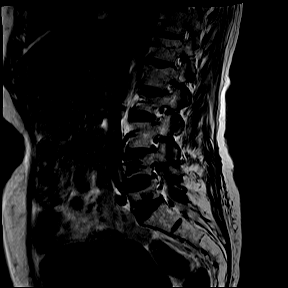
[im 7/13]
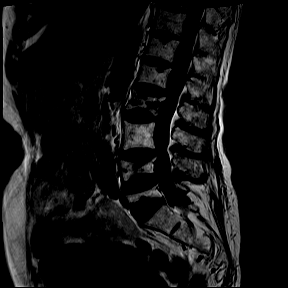
[im 10/13]
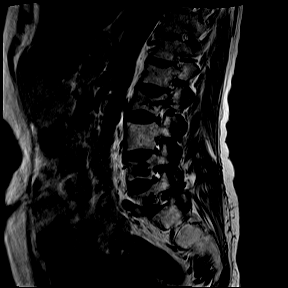
[im 13/13]
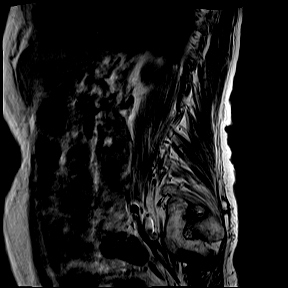

[Series 11: T2 · coronal · 5.0mm · 0.66mm/px · 8 of 18 slices shown (2 of 3)]
[im 1/18]
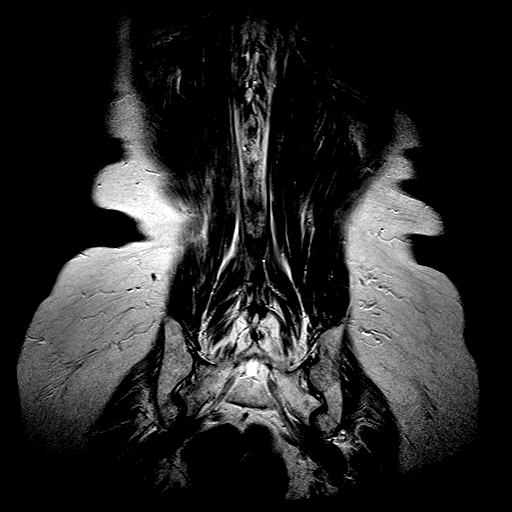
[im 3/18]
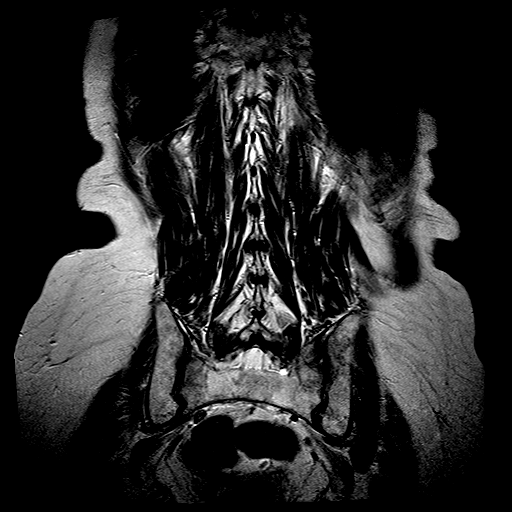
[im 5/18]
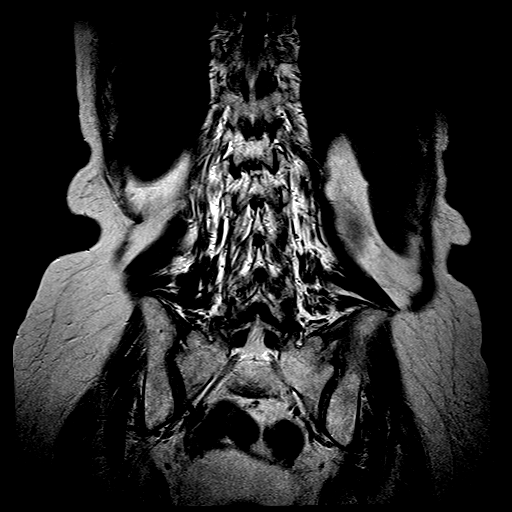
[im 8/18]
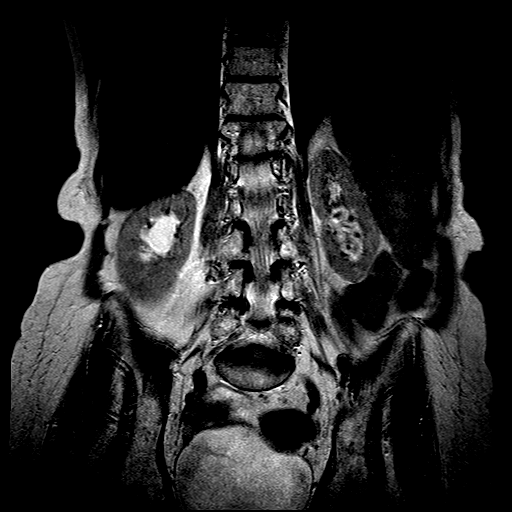
[im 10/18]
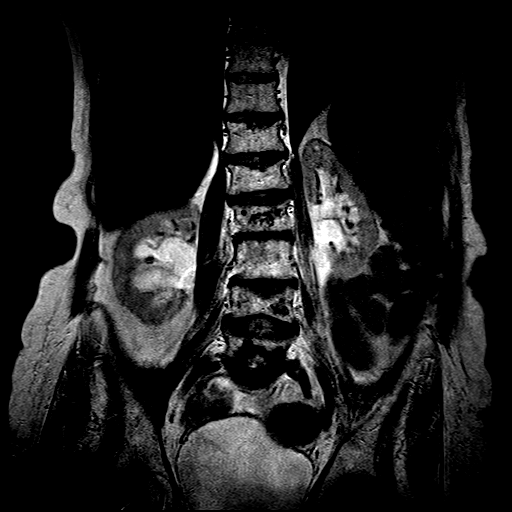
[im 13/18]
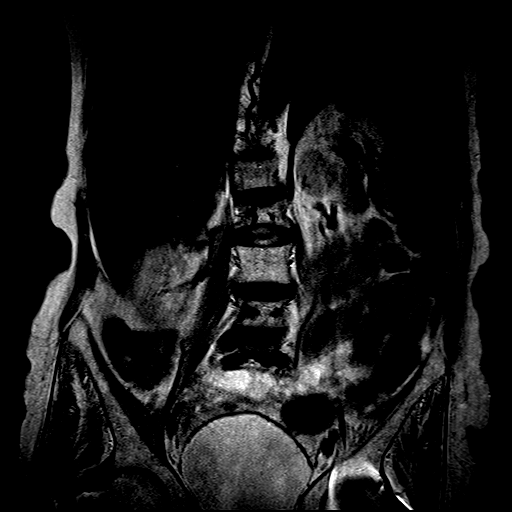
[im 15/18]
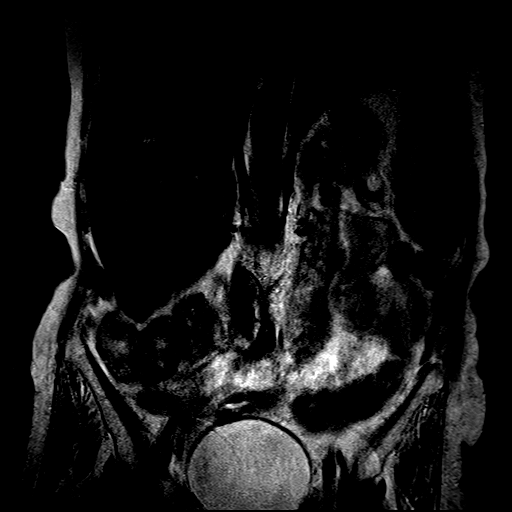
[im 18/18]
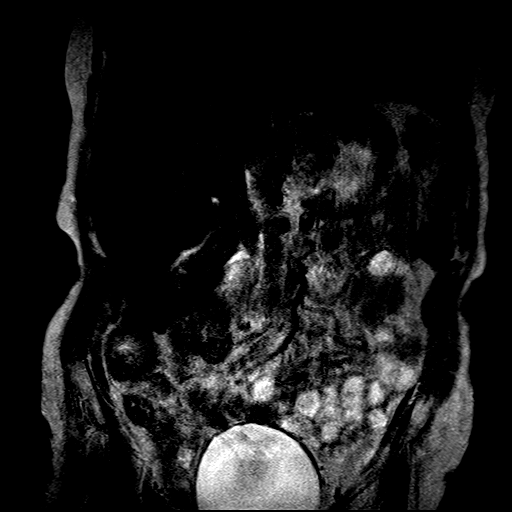

[Series 12: T2 · axial · 4.0mm · 0.52mm/px · z∈[-71,+159]mm · 9 of 29 slices shown (3 of 3)]
[im 1/29]
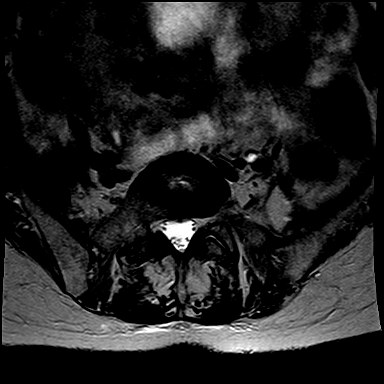
[im 6/29]
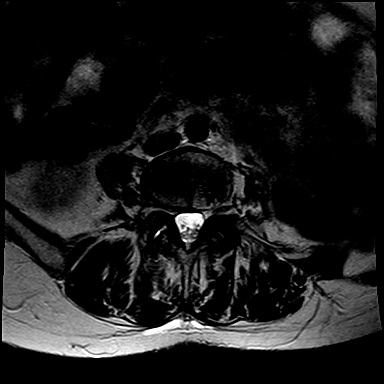
[im 8/29]
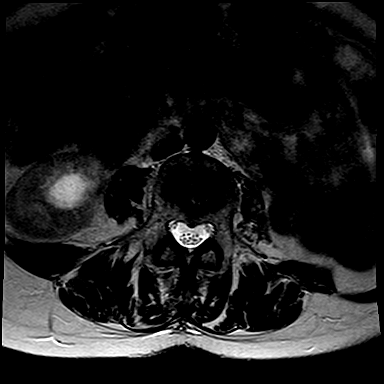
[im 13/29]
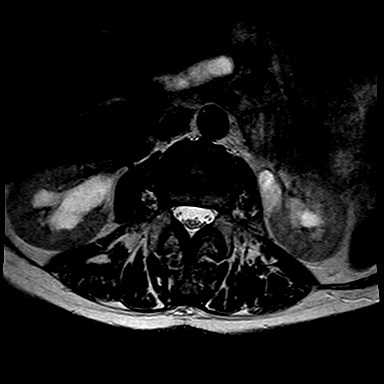
[im 16/29]
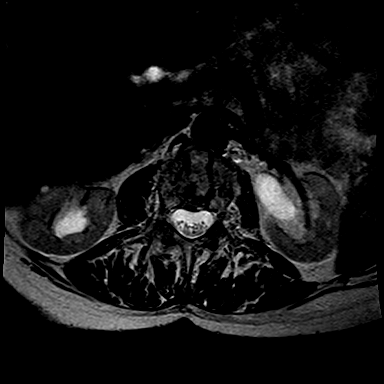
[im 21/29]
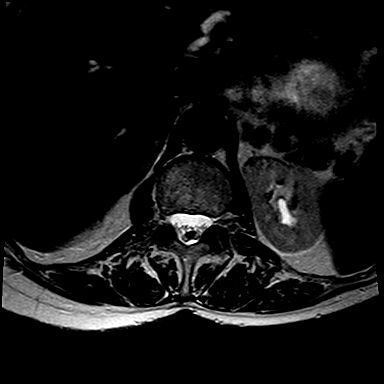
[im 23/29]
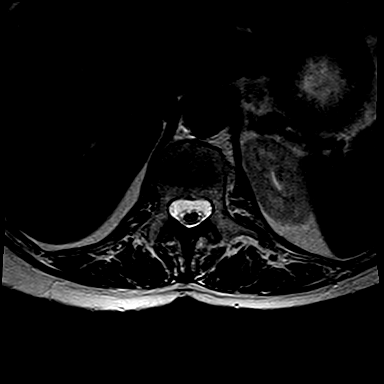
[im 26/29]
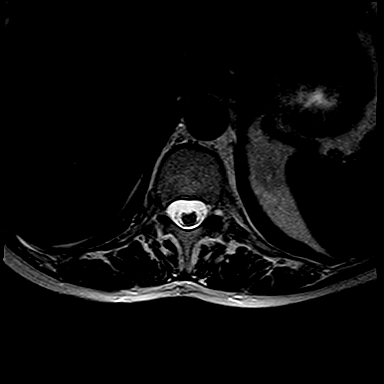
[im 29/29]
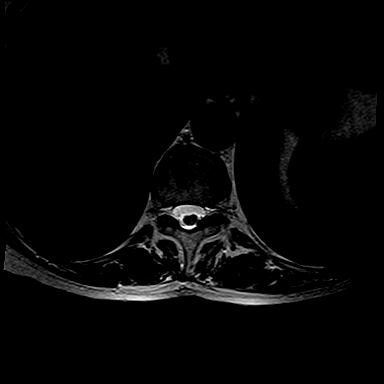

[Series 13: T1 · axial · 4.0mm · 0.52mm/px · z∈[-71,+7]mm · 3 of 29 slices shown (2 of 2)]
[im 1/29]
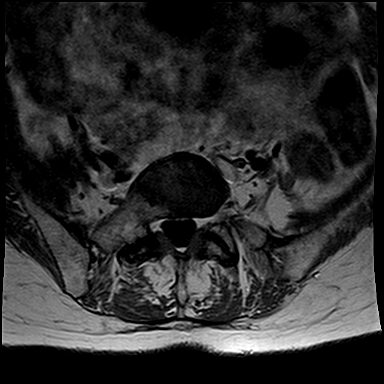
[im 6/29]
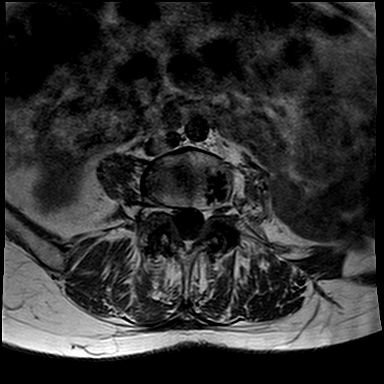
[im 8/29]
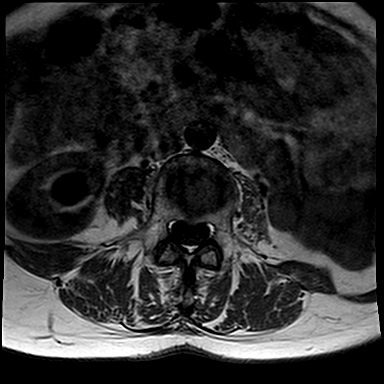

[31 of 48 positions shown; findings below may reference images not displayed]

FINDINGS: There are chronic L2, L4 and L5 compression fractures with changes of kyphoplasty. No acute fracture or subluxation is noted.  Distal spinal cord is normal in signal intensity and terminates normally at L1-2 disc space level. Spinal canal is congenitally narrow.

At T12-L1 level, there is a small broad-based central disc bulge mildly effacing the ventral thecal sac.  There is no significant neural foraminal stenosis. 

At L1-2 level, minimal retropulsion of the posterior superior L2 vertebral body margin mildly effaces the ventral thecal sac.  There is no significant disc herniation or neural foraminal stenosis. 

L2-3 level appears unremarkable. 

At L3-4 level, mild retropulsion of L4 posterior superior vertebral body margin also mildly effaces the ventral thecal sac.  There is mild-to-moderate right neural foraminal stenosis from facet arthropathy and bulging annulus. 

At L4-5 level, there is no significant disc herniation. Faint T2 signal hyperintensity is noted within the intervertebral disc. There is mild-to-moderate bilateral neural foraminal stenosis from facet arthropathy.

At L5-S1 level, there is mild bilateral neural foraminal stenosis from facet arthropathy. 

Paraspinal soft tissues are unremarkable.
IMPRESSION: 1. Chronic L2, L4 and L5 vertebral body compression fractures with changes of kyphoplasty. No acute fracture.  

2. Faint T2 signal hyperintensity within L4-5 intervertebral disc, likely from atypical degenerative changes. Differential considerations would also include discitis/osteomyelitis. Continued close clinical follow-up is recommended.  

3. No significant disc herniation or spinal stenosis at any level. 

4. Multilevel neural foraminal stenosis as detailed above.

## 2021-09-20 ENCOUNTER — Other Ambulatory Visit (INDEPENDENT_AMBULATORY_CARE_PROVIDER_SITE_OTHER): Payer: Self-pay | Admitting: Family

## 2021-09-20 DIAGNOSIS — I10 Essential (primary) hypertension: Secondary | ICD-10-CM

## 2021-09-20 DIAGNOSIS — Z01818 Encounter for other preprocedural examination: Secondary | ICD-10-CM

## 2021-09-20 DIAGNOSIS — I272 Pulmonary hypertension, unspecified: Secondary | ICD-10-CM

## 2021-09-20 DIAGNOSIS — J441 Chronic obstructive pulmonary disease with (acute) exacerbation: Secondary | ICD-10-CM

## 2021-09-20 NOTE — Progress Notes (Signed)
[place] with patient and husband on the phone.  Patient is scheduled for surgery on 09/29/2021 with Dr Cyndia Diver.  Patient needs repeat CXR and labs due to abnormal CXR last month.  Patient did not have 2 week repeat CXR completed.  Patient agrees to get labs, CXR tomorrow.

## 2021-09-21 ENCOUNTER — Other Ambulatory Visit: Payer: Self-pay

## 2021-09-21 ENCOUNTER — Inpatient Hospital Stay (HOSPITAL_COMMUNITY)
Admission: RE | Admit: 2021-09-21 | Discharge: 2021-09-21 | Disposition: A | Discharge status: Home / Self Care | Payer: Medicare Other | Source: Ambulatory Visit | Attending: Family | Admitting: Family

## 2021-09-21 ENCOUNTER — Other Ambulatory Visit: Payer: Medicare Other | Attending: Family

## 2021-09-21 ENCOUNTER — Encounter (INDEPENDENT_AMBULATORY_CARE_PROVIDER_SITE_OTHER): Payer: Self-pay | Admitting: OTOLARYNGOLOGY

## 2021-09-21 ENCOUNTER — Ambulatory Visit (INDEPENDENT_AMBULATORY_CARE_PROVIDER_SITE_OTHER): Payer: Medicare Other | Admitting: OTOLARYNGOLOGY

## 2021-09-21 VITALS — Ht <= 58 in | Wt 111.0 lb

## 2021-09-21 DIAGNOSIS — J441 Chronic obstructive pulmonary disease with (acute) exacerbation: Secondary | ICD-10-CM

## 2021-09-21 DIAGNOSIS — I10 Essential (primary) hypertension: Secondary | ICD-10-CM

## 2021-09-21 DIAGNOSIS — I272 Pulmonary hypertension, unspecified: Secondary | ICD-10-CM

## 2021-09-21 DIAGNOSIS — Z01818 Encounter for other preprocedural examination: Secondary | ICD-10-CM | POA: Insufficient documentation

## 2021-09-21 DIAGNOSIS — L570 Actinic keratosis: Secondary | ICD-10-CM

## 2021-09-21 DIAGNOSIS — D0439 Carcinoma in situ of skin of other parts of face: Secondary | ICD-10-CM

## 2021-09-21 DIAGNOSIS — H903 Sensorineural hearing loss, bilateral: Secondary | ICD-10-CM

## 2021-09-21 LAB — BASIC METABOLIC PANEL
ANION GAP: 5 mmol/L — ABNORMAL LOW (ref 10–20)
BUN/CREA RATIO: 16 (ref 6–22)
BUN: 16 mg/dL (ref 7–25)
CALCIUM: 9.1 mg/dL (ref 8.6–10.3)
CHLORIDE: 103 mmol/L (ref 98–107)
CO2 TOTAL: 31 mmol/L (ref 21–31)
CREATININE: 0.99 mg/dL (ref 0.60–1.30)
ESTIMATED GFR: 61 mL/min/{1.73_m2} (ref >59)
GLUCOSE: 91 mg/dL (ref 74–109)
OSMOLALITY, CALCULATED: 278 mOsm/kg (ref 270–290)
POTASSIUM: 4.8 mmol/L (ref 3.5–5.1)
SODIUM: 139 mmol/L (ref 136–145)

## 2021-09-21 LAB — CBC WITH DIFF
BASOPHIL #: 0.1 10*3/uL (ref 0.00–0.30)
BASOPHIL %: 1 % (ref 0–3)
EOSINOPHIL #: 0 10*3/uL (ref 0.00–0.80)
EOSINOPHIL %: 1 % (ref 0–7)
HCT: 39.3 % (ref 37.0–47.0)
HGB: 13 g/dL (ref 12.5–16.0)
LYMPHOCYTE #: 1.1 10*3/uL (ref 1.10–5.00)
LYMPHOCYTE %: 26 % (ref 25–45)
MCH: 28.9 pg (ref 27.0–32.0)
MCHC: 33.1 g/dL (ref 32.0–36.0)
MCV: 87.3 fL (ref 78.0–99.0)
MONOCYTE #: 0.4 10*3/uL (ref 0.00–1.30)
MONOCYTE %: 8 % (ref 0–12)
MPV: 7.1 fL — ABNORMAL LOW (ref 7.4–10.4)
NEUTROPHIL #: 2.7 10*3/uL (ref 1.80–8.40)
NEUTROPHIL %: 64 % (ref 40–76)
PLATELETS: 124 10*3/uL — ABNORMAL LOW (ref 140–440)
RBC: 4.5 10*6/uL (ref 4.20–5.40)
RDW: 14.2 % (ref 11.6–14.8)
WBC: 4.3 10*3/uL (ref 4.0–10.5)
WBCS UNCORRECTED: 4.3 10*3/uL

## 2021-09-21 NOTE — H&P (Signed)
ENT, [place]  [address]  [place] [address]  Phone: [phone]  Fax: [phone]      Encounter Date: 09/21/2021    Patient ID: Laura Oconnell  MRN: [id number]    DOB: 1950/06/08  Age: 71 y.o. female     Progress Note       Referring Provider:  No ref. provider found    Reason for Visit:   Chief Complaint   Patient presents with   . Follow-up After Testing     Rc after audio        History of Present Illness:  Laura Oconnell is a 71 y.o. female who is FU on hearing. She has tried HAIDs in the past but did not like them.     Audiogram: AU Mod to Sev SNHL  Patient History:  Patient Active Problem List   Diagnosis   . Bronchiectasis with (acute) exacerbation (CMS HCC)   . Chronic obstructive pulmonary disease with acute exacerbation (CMS HCC)   . Chronic pain disorder   . Chronic, continuous use of opioids   . Closed right hip fracture, initial encounter (CMS [place])   . Compression fracture   . Depression   . Difficulty with speech   . Essential hypertension   . GAD (generalized anxiety disorder)   . Gastroesophageal reflux disease without esophagitis   . Hypocalcemia   . Hypoglycemia   . Hyponatremia   . Intracranial aneurysm   . Myofascial pain syndrome   . Neuropathic pain   . Opioid use agreement exists   . Osteoporosis   . Other specified alveolar and parietoalveolar pneumonopathies   . Spasm of back muscles   . Spondylosis of thoracic region without myelopathy or radiculopathy     Current Outpatient Medications   Medication Sig   . azithromycin (ZITHROMAX) 250 mg Oral Tablet Take 500 mg (2 tab) on day 1; take 250 mg (1 tab) on days 2-5.   . baclofen (LIORESAL) 10 mg Oral Tablet Take 1 Tablet (10 mg total) by mouth   . baclofen (LIORESAL) 5 mg Oral Tablet Take 1 Tablet (5 mg total) by mouth Three times a day   . Calcium-Cholecalciferol, D3, 600 mg-5 mcg (200 unit) Oral Capsule Take 1 Capsule by mouth Once a day   . COPPER ORAL Take 2 mg by mouth Once a day   . Diltiazem HCl (TIAZAC) 120 mg  Oral Capsule,Sustained Action 24 hr Take 1 Capsule (120 mg total) by mouth Once a day   . ferrous sulfate (FERATAB) 324 mg (65 mg iron) Oral Tablet, Delayed Release (E.C.) Take 1 Tablet (324 mg total) by mouth Once a day   . folic acid (FOLVITE) 1 mg Oral Tablet Take 2 Tablets (2 mg total) by mouth Once a day   . furosemide (LASIX) 20 mg Oral Tablet Take 1 Tablet (20 mg total) by mouth Once a day   . gabapentin (NEURONTIN) 800 mg Oral Tablet Take 1 Tablet (800 mg total) by mouth Three times a day   . HYDROcodone-acetaminophen (NORCO) 5-325 mg Oral Tablet Take 1 Tablet by mouth Every 6 hours as needed   . memantine (NAMENDA) 10 mg Oral Tablet 1 Tablet (10 mg total) Twice daily   . omeprazole (PRILOSEC) 20 mg Oral Capsule, Delayed Release(E.C.) Take 1 Capsule (20 mg total) by mouth   . oxyCODONE (OXYCONTIN) 10 mg Oral tablet,oral only,ext.rel.12 hr Take 1 Tablet (10 mg total) by mouth Every 12 hours   . PARoxetine (PAXIL)  40 mg Oral Tablet Take 1 Tablet (40 mg total) by mouth Every morning   . rOPINIRole (REQUIP) 0.5 mg Oral Tablet Take 1 Tablet (0.5 mg total) by mouth Every night   . tiotropium bromide (SPIRIVA HANDIHALER) 18 mcg Inhalation Capsule, w/Inhalation Device Take 1 Capsule (18 mcg total) by inhalation Once a day      No Known Allergies  Past Medical History:   Diagnosis Date   . Bronchiectasis with (acute) exacerbation (CMS HCC) 08/03/2021   . Chronic obstructive pulmonary disease with acute exacerbation (CMS HCC) 09/18/2015   . Chronic pain disorder 05/29/2015   . Chronic, continuous use of opioids 08/03/2021   . Closed right hip fracture, initial encounter (CMS [place]) 02/01/2021   . Compression fracture 05/29/2015    Formatting of this note might be different from the original. Per Dr. Merleen Nicely, Neurosurgery; kyphoplasty x 2   . Depression 05/29/2015   . Difficulty with speech 08/03/2021   . Esophageal reflux    . Essential hypertension 05/29/2015   . GAD (generalized anxiety disorder) 05/29/2015   . Gastroesophageal  reflux disease without esophagitis 05/29/2015   . Hypocalcemia 05/29/2015   . Hypoglycemia 11/25/2016   . Hyponatremia 08/03/2021   . Intracranial aneurysm 08/03/2021   . Myofascial pain syndrome 06/29/2018   . Neuropathic pain 06/29/2018   . Opioid use agreement exists 06/29/2018   . Osteoporosis 05/29/2015   . 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   . Spasm of back muscles 06/29/2018   . Spondylosis of thoracic region without myelopathy or radiculopathy 06/29/2018     History reviewed. No pertinent surgical history.  Family Medical History:    None         Social History     Tobacco Use   . Smoking status: Former     Packs/day: 2.00     Years: 42.00     Pack years: 84.00     Types: Cigarettes     Quit date: 2008     Years since quitting: 15.4   . Smokeless tobacco: Never   Vaping Use   . Vaping Use: Never used   Substance Use Topics   . Drug use: Never       Review of Systems:  Review of Systems    Physical Exam:  Ht 1.473 m ('4\' 10"'$ )   Wt 50.3 kg (111 lb)   BMI 23.20 kg/m       Physical Exam  Constitutional:       Appearance: Normal appearance. She is well-developed, well-groomed and normal weight.   HENT:      Head: Normocephalic and atraumatic.      Comments: Right forehead lesion SCC     Right Ear: Hearing, tympanic membrane, ear canal and external ear normal.      Left Ear: Hearing, tympanic membrane, ear canal and external ear normal.      Nose: Septal deviation and mucosal edema present.      Right Turbinates: Enlarged.      Left Turbinates: Enlarged.      Mouth/Throat:      Lips: Pink.      Mouth: Mucous membranes are moist.      Pharynx: Oropharynx is clear. Uvula midline.   Eyes:      Extraocular Movements: Extraocular movements intact.   Neck:      Trachea: Phonation normal.   Pulmonary:      Effort:  Pulmonary effort is normal.   Musculoskeletal:      Cervical back: Normal range of motion and neck supple.    Lymphadenopathy:      Cervical: No cervical adenopathy.   Skin:     General: Skin is warm.   Neurological:      Mental Status: She is alert and oriented to person, place, and time.      Cranial Nerves: Cranial nerves 2-12 are intact. No facial asymmetry.   Psychiatric:         Attention and Perception: Attention normal.         Mood and Affect: Mood normal.         Speech: Speech normal.         Behavior: Behavior normal. Behavior is cooperative.         Assessment:  ENCOUNTER DIAGNOSES     ICD-10-CM   1. 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 Actinic keratosis of forehead  L57.0   3. Sensorineural hearing loss (SNHL) of both ears  H90.3       Plan:  Medical records reviewed on 09/21/2021.  Reviewed audiogram. Pt not interested in HAIDs.     Return for Follow up after mohs.    Mathis Dad, PA-C  09/21/2021, 15:31   The advanced practice clinician's documentation was reviewed/amended in its entirety with the assessment and plan portion completely performed independently by me during this separate encounter.

## 2021-09-22 ENCOUNTER — Encounter (INDEPENDENT_AMBULATORY_CARE_PROVIDER_SITE_OTHER): Payer: Self-pay | Admitting: Family

## 2021-09-22 NOTE — Result Encounter Note (Signed)
Cardiac clearance given

## 2021-09-29 ENCOUNTER — Other Ambulatory Visit: Payer: Self-pay

## 2021-09-29 ENCOUNTER — Ambulatory Visit (INDEPENDENT_AMBULATORY_CARE_PROVIDER_SITE_OTHER): Payer: Medicare Other | Admitting: OTOLARYNGOLOGY

## 2021-09-29 ENCOUNTER — Encounter (INDEPENDENT_AMBULATORY_CARE_PROVIDER_SITE_OTHER): Payer: Self-pay | Admitting: OTOLARYNGOLOGY

## 2021-09-29 VITALS — Ht <= 58 in | Wt 111.0 lb

## 2021-09-29 DIAGNOSIS — D0439 Carcinoma in situ of skin of other parts of face: Secondary | ICD-10-CM

## 2021-09-29 NOTE — H&P (View-Only) (Signed)
[place]  ENT, [place]    Progress Note    Name: Laura Oconnell MRN:  [id number]   Date: 09/29/2021 Age: 71 y.o.          Follow Up      Subjective:   Chief Complaint:   Skin Check (Pw for mohs repair)       History of Present Illness:  Laura Oconnell is a 71 y.o. old female who presents to the clinic for follow-up after mohs. No new complaints.    Review of Systems     Physical Exam:     Vitals:    09/29/21 1626   Weight: 50.3 kg (111 lb)   Height: 1.473 m ('4\' 10"'$ )   BMI: 23.25      ENT Physical Exam           Assessment and Plan:       ICD-10-CM    1. 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       Follow up:  Return for Follow up for surgery.    Dia Sitter, DO

## 2021-10-01 ENCOUNTER — Other Ambulatory Visit: Payer: Self-pay

## 2021-10-01 ENCOUNTER — Encounter (HOSPITAL_COMMUNITY)
Admission: RE | Disposition: A | Discharge status: Home / Self Care | Payer: Self-pay | Source: Ambulatory Visit | Attending: OTOLARYNGOLOGY

## 2021-10-01 ENCOUNTER — Ambulatory Visit (HOSPITAL_COMMUNITY): Payer: Medicare Other | Admitting: Anesthesiology

## 2021-10-01 ENCOUNTER — Encounter (HOSPITAL_COMMUNITY): Payer: Self-pay | Admitting: OTOLARYNGOLOGY

## 2021-10-01 ENCOUNTER — Inpatient Hospital Stay
Admission: RE | Admit: 2021-10-01 | Discharge: 2021-10-01 | Disposition: A | Discharge status: Home / Self Care | Payer: Medicare Other | Source: Ambulatory Visit | Attending: OTOLARYNGOLOGY | Admitting: OTOLARYNGOLOGY

## 2021-10-01 DIAGNOSIS — C44329 Squamous cell carcinoma of skin of other parts of face: Secondary | ICD-10-CM | POA: Insufficient documentation

## 2021-10-01 DIAGNOSIS — D0439 Carcinoma in situ of skin of other parts of face: Secondary | ICD-10-CM

## 2021-10-01 DIAGNOSIS — Z428 Encounter for other plastic and reconstructive surgery following medical procedure or healed injury: Secondary | ICD-10-CM | POA: Insufficient documentation

## 2021-10-01 SURGERY — PLACEMENT FLAP LOCAL TO FACIAL DEFECT
Anesthesia: Monitor Anesthesia Care | Site: Face | Laterality: Left | Wound class: Clean Wound: Uninfected operative wounds in which no inflammation occurred

## 2021-10-01 MED ORDER — ONDANSETRON HCL (PF) 4 MG/2 ML INJECTION SOLUTION
4.0000 mg | Freq: Once | INTRAMUSCULAR | Status: DC | PRN
Start: 2021-10-01 — End: 2021-10-01

## 2021-10-01 MED ORDER — PROPOFOL 10 MG/ML INTRAVENOUS EMULSION
INTRAVENOUS | Status: DC | PRN
Start: 2021-10-01 — End: 2021-10-01
  Administered 2021-10-01: 75 ug/kg/min via INTRAVENOUS
  Administered 2021-10-01: 0 ug/kg/min via INTRAVENOUS

## 2021-10-01 MED ORDER — IPRATROPIUM 0.5 MG-ALBUTEROL 3 MG (2.5 MG BASE)/3 ML NEBULIZATION SOLN
3.0000 mL | INHALATION_SOLUTION | Freq: Once | RESPIRATORY_TRACT | Status: DC | PRN
Start: 2021-10-01 — End: 2021-10-01

## 2021-10-01 MED ORDER — SODIUM CHLORIDE 0.9 % INTRAVENOUS PIGGYBACK
INJECTION | INTRAVENOUS | Status: AC
Start: 2021-10-01 — End: 2021-10-01
  Filled 2021-10-01: qty 100

## 2021-10-01 MED ORDER — LIDOCAINE HCL 20 MG/ML (2 %) INJECTION SOLUTION
Freq: Once | INTRAMUSCULAR | Status: DC | PRN
Start: 2021-10-01 — End: 2021-10-01
  Administered 2021-10-01: 40 mg

## 2021-10-01 MED ORDER — FENTANYL (PF) 50 MCG/ML INJECTION WRAPPER
INJECTION | Freq: Once | INTRAMUSCULAR | Status: DC | PRN
Start: 2021-10-01 — End: 2021-10-01
  Administered 2021-10-01 (×2): 50 ug via INTRAVENOUS

## 2021-10-01 MED ORDER — SODIUM CHLORIDE 0.9 % (FLUSH) INJECTION SYRINGE
3.0000 mL | INJECTION | Freq: Three times a day (TID) | INTRAMUSCULAR | Status: DC
Start: 2021-10-01 — End: 2021-10-01

## 2021-10-01 MED ORDER — FENTANYL (PF) 50 MCG/ML INJECTION WRAPPER
25.0000 ug | INJECTION | INTRAMUSCULAR | Status: DC | PRN
Start: 2021-10-01 — End: 2021-10-01

## 2021-10-01 MED ORDER — LACTATED RINGERS INTRAVENOUS SOLUTION
INTRAVENOUS | Status: DC
Start: 2021-10-01 — End: 2021-10-01

## 2021-10-01 MED ORDER — MIDAZOLAM 5 MG/ML INJECTION WRAPPER
1.0000 mg | Freq: Once | INTRAMUSCULAR | Status: DC | PRN
Start: 2021-10-01 — End: 2021-10-01
  Administered 2021-10-01: 1 mg via INTRAVENOUS

## 2021-10-01 MED ORDER — DEXAMETHASONE SODIUM PHOSPHATE 4 MG/ML INJECTION SOLUTION
4.0000 mg | Freq: Once | INTRAMUSCULAR | Status: AC
Start: 2021-10-01 — End: 2021-10-01
  Administered 2021-10-01: 4 mg via INTRAVENOUS

## 2021-10-01 MED ORDER — SODIUM CHLORIDE 0.9 % (FLUSH) INJECTION SYRINGE
3.0000 mL | INJECTION | INTRAMUSCULAR | Status: DC | PRN
Start: 2021-10-01 — End: 2021-10-01

## 2021-10-01 MED ORDER — LIDOCAINE 1 %-EPINEPHRINE 1:100,000 INJECTION SOLUTION
Freq: Once | INTRAMUSCULAR | Status: DC | PRN
Start: 2021-10-01 — End: 2021-10-01
  Administered 2021-10-01: 3 mL via INTRAMUSCULAR

## 2021-10-01 MED ORDER — FAMOTIDINE (PF) 20 MG/2 ML INTRAVENOUS SOLUTION
INTRAVENOUS | Status: AC
Start: 2021-10-01 — End: 2021-10-01
  Filled 2021-10-01: qty 2

## 2021-10-01 MED ORDER — ONDANSETRON HCL (PF) 4 MG/2 ML INJECTION SOLUTION
4.0000 mg | Freq: Once | INTRAMUSCULAR | Status: AC
Start: 2021-10-01 — End: 2021-10-01
  Administered 2021-10-01: 4 mg via INTRAVENOUS

## 2021-10-01 MED ORDER — ONDANSETRON HCL (PF) 4 MG/2 ML INJECTION SOLUTION
INTRAMUSCULAR | Status: AC
Start: 2021-10-01 — End: 2021-10-01
  Filled 2021-10-01: qty 2

## 2021-10-01 MED ORDER — CEFAZOLIN 1 GRAM SOLUTION FOR INJECTION
Freq: Once | INTRAMUSCULAR | Status: DC | PRN
Start: 2021-10-01 — End: 2021-10-01
  Administered 2021-10-01: 2000 mg via INTRAVENOUS

## 2021-10-01 MED ORDER — MUPIROCIN 2 % TOPICAL OINTMENT
TOPICAL_OINTMENT | Freq: Once | CUTANEOUS | Status: DC | PRN
Start: 2021-10-01 — End: 2021-10-01
  Administered 2021-10-01: 1 via TOPICAL

## 2021-10-01 MED ORDER — FAMOTIDINE (PF) 20 MG/2 ML INTRAVENOUS SOLUTION
20.0000 mg | Freq: Once | INTRAVENOUS | Status: AC
Start: 2021-10-01 — End: 2021-10-01
  Administered 2021-10-01: 20 mg via INTRAVENOUS

## 2021-10-01 MED ORDER — ONDANSETRON 4 MG DISINTEGRATING TABLET
4.0000 mg | ORAL_TABLET | Freq: Once | ORAL | Status: AC
Start: 2021-10-01 — End: 2021-10-01
  Administered 2021-10-01: 4 mg via ORAL
  Filled 2021-10-01: qty 1

## 2021-10-01 MED ORDER — CEFAZOLIN 1 GRAM SOLUTION FOR INJECTION
INTRAMUSCULAR | Status: AC
Start: 2021-10-01 — End: 2021-10-01
  Filled 2021-10-01: qty 20

## 2021-10-01 MED ORDER — DEXAMETHASONE SODIUM PHOSPHATE 4 MG/ML INJECTION SOLUTION
INTRAMUSCULAR | Status: AC
Start: 2021-10-01 — End: 2021-10-01
  Filled 2021-10-01: qty 1

## 2021-10-01 MED ORDER — HYDROCODONE 7.5 MG-ACETAMINOPHEN 325 MG/15 ML ORAL SOLUTION
7.5000 mg | Freq: Four times a day (QID) | ORAL | Status: DC | PRN
Start: 2021-10-01 — End: 2021-10-01
  Administered 2021-10-01: 7.5 mg via ORAL
  Filled 2021-10-01: qty 15

## 2021-10-01 MED ORDER — LIDOCAINE 1 %-EPINEPHRINE 1:100,000 INJECTION SOLUTION
INTRAMUSCULAR | Status: AC
Start: 2021-10-01 — End: 2021-10-01
  Filled 2021-10-01: qty 50

## 2021-10-01 MED ORDER — ALBUTEROL SULFATE 2.5 MG/3 ML (0.083 %) SOLUTION FOR NEBULIZATION
2.5000 mg | INHALATION_SOLUTION | Freq: Once | RESPIRATORY_TRACT | Status: DC | PRN
Start: 2021-10-01 — End: 2021-10-01

## 2021-10-01 MED ORDER — FENTANYL (PF) 50 MCG/ML INJECTION SOLUTION
INTRAMUSCULAR | Status: AC
Start: 2021-10-01 — End: 2021-10-01
  Filled 2021-10-01: qty 2

## 2021-10-01 MED ORDER — FENTANYL (PF) 50 MCG/ML INJECTION WRAPPER
50.0000 ug | INJECTION | INTRAMUSCULAR | Status: DC | PRN
Start: 2021-10-01 — End: 2021-10-01

## 2021-10-01 MED ORDER — MIDAZOLAM 5 MG/ML INJECTION WRAPPER
INTRAMUSCULAR | Status: AC
Start: 2021-10-01 — End: 2021-10-01
  Filled 2021-10-01: qty 1

## 2021-10-01 MED ORDER — PROPOFOL 10 MG/ML IV BOLUS
INJECTION | Freq: Once | INTRAVENOUS | Status: DC | PRN
Start: 2021-10-01 — End: 2021-10-01
  Administered 2021-10-01: 20 mg via INTRAVENOUS
  Administered 2021-10-01: 30 mg via INTRAVENOUS
  Administered 2021-10-01: 20 mg via INTRAVENOUS

## 2021-10-01 MED ORDER — MUPIROCIN 2 % TOPICAL OINTMENT
TOPICAL_OINTMENT | CUTANEOUS | Status: AC
Start: 2021-10-01 — End: 2021-10-01
  Filled 2021-10-01: qty 44

## 2021-10-01 SURGICAL SUPPLY — 43 items
BLADE 15 2 END CBNSTL SURG STRL DISP (CUTTING ELEMENTS) ×2
BLADE 15 2 END CBNSTL SURG STRL DISP (SURGICAL CUTTING SUPPLIES) ×2 IMPLANT
CLEANER INSTR PREPZYME MUL-TRD CONTAINR NARSL NEUT PH BDGR (MISCELLANEOUS PT CARE ITEMS) ×1
CONV USE 23866 - NEEDLE HYPO 27GA 1.5IN STD MONOJECT SS POLYPROP REG BVL LL HUB UL SHRP ANTICORE YW STRL LF  DISP (MED SURG SUPPLIES) ×1 IMPLANT
CONV USE 31829 - NEEDLE HYPO  18GA 1.5IN STD REG BVL LF (MED SURG SUPPLIES) ×1 IMPLANT
CONV USE ITEM 329146 - CLEANER INSTR PREPZYME MUL-TRD CONTAINR NARSL NEUT PH BDGR 22OZ (MISCELLANEOUS PT CARE ITEMS) ×1 IMPLANT
CONV USE ITEM 343591 - SOLIDIFY FLUID 1500CC NONST LF  PREM SOLIDIFY + (MED SURG SUPPLIES) ×1 IMPLANT
CORD BIPOLAR 12FT CAUT STD FORCEPS STRL LF (ENDOSCOPIC SUPPLIES) ×1 IMPLANT
CORD BIPOLAR 12FT CAUT STD FORCEPS STRL LF (INSTRUMENTS ENDOMECHANICAL) ×1
COUNTER 20 CNT BLOCK ADH NEEDLE STRL LF  RD SHARP FOAM 15.75X11.5X14IN DISP (MED SURG SUPPLIES) ×1 IMPLANT
COUNTER 20 CNT BLOCK ADH NEEDLE STRL LF RD SHARP FOAM 15.75 (MED SURG SUPPLIES) ×1
GLOVE SURG 7.5 LF PF SMOOTH STRL WHT PLISPRN (GLOVES AND ACCESSORIES) ×1
GLOVE SURG LF  PF STRL 7.5 PLISPRN DISP (GLOVES AND ACCESSORIES) ×1 IMPLANT
GOWN SURG XL STD LGTH L3 HKLP CLSR RGLN SLEEVE TWL STRL LF (DRAPE/PACKS/SHEETS/OR TOWEL) ×3
GOWN SURG XL STD LGTH L3 HKLP CLSR RGLN SLEEVE TWL STRL LF  DISP GRN AERO BLU PRFRM FBRC (DRAPE/PACKS/SHEETS/OR TOWEL) ×3 IMPLANT
HDPE THK22 UM C40-45 GL L48 IN X W40 IN NATURAL (MISCELLANEOUS PT CARE ITEMS) ×2 IMPLANT
LINER SUCT MEDIVAC CRD TW LOCK LID SHTOF VALVE CAN PORT 3L LF  DISP (MED SURG SUPPLIES) ×1 IMPLANT
LINER SUCT MEDIVAC CRD TW LOCK_LID SHTOF VALVE CAN PORT 3L (MED SURG SUPPLIES) ×1
NEEDLE HYPO  27GA 1.5IN STD MONOJECT SS POLYPROP REG BVL LL (MED SURG SUPPLIES) ×1
NEEDLE HYPO 18GA 1.5IN STD RE_G BVL LF (MED SURG SUPPLIES) ×1
PACK SURG ECLIPSE ENT I STRL LF (CUSTOM TRAYS & PACK) ×1 IMPLANT
PACK SURG SIRUS ENT I STRL LF (CUSTOM TRAYS & PACK) ×1
PEN SURG MRKNG DISP RLR LBL STRL LF  6IN (MED SURG SUPPLIES) ×1 IMPLANT
PEN SURG MRKNG DISP RLR LBL STRL LF 6IN (MED SURG SUPPLIES) ×2
SOL IRRG 0.9% NACL 1000ML PLASTIC PR BTL ISTNC N-PYRG STRL LF (MEDICATIONS/SOLUTIONS) ×1 IMPLANT
SOLIDIFY FLUID 1500CC NONST LF  PREM SOLIDIFY + (MED SURG SUPPLIES) ×1
SOLIDIFY FLUID 1500CC NONST LF PREM SOLIDIFY + (MED SURG SUPPLIES) ×1
SOLUTION IRRG NS 2F7124 1000CC_12/CS (MEDICATIONS/SOLUTIONS) ×1
SPONGE GAUZE 4X4IN MDCHC COTTON 12 PLY TY 7 LF  STRL DISP (WOUND CARE SUPPLY) ×2 IMPLANT
SPONGE GAUZE 4X4IN MDCHC COTTO_N 12 PLY TY 7 LF STRL DISP (WOUND CARE/ENTEROSTOMAL SUPPLY) ×2
SUTURE 4-0 P-12 BIOSYN SURGALLOY 18IN UNDYED MONOF ABS (SUTURE/WOUND CLOSURE) ×4 IMPLANT
SUTURE 5-0 P-13 BIOSYN SURGALLOY 18IN UNDYED MONOF ABS (SUTURE/WOUND CLOSURE) ×2 IMPLANT
SUTURE 5-0 PC-13 POLYSRB 18IN UNDYED BRD COAT ABS (SUTURE/WOUND CLOSURE) ×2 IMPLANT
SUTURE 6-0 PC-13 SURGIPRO2 18IN BLU MONOF NONAB (SUTURE/WOUND CLOSURE) ×4 IMPLANT
SYRINGE LL 10ML LF  STRL GRAD N-PYRG DEHP-FR PVC FREE MED DISP (MED SURG SUPPLIES) ×1 IMPLANT
SYRINGE LL 10ML LF STRL MED D_ISP (MED SURG SUPPLIES) ×1
TOWEL 24X16IN COTTON BLU DISP SURG STRL LF (DRAPE/PACKS/SHEETS/OR TOWEL) ×4 IMPLANT
TUBE BUBBLE CONNECTING_8888280214 1EA/BX/CS (MED SURG SUPPLIES) ×1
TUBING SUCT CLR 100FT 3/16IN ARGYLE UNIV PVC NCDTV BBL NONST LF (MED SURG SUPPLIES) ×1 IMPLANT
TUBING SUCT CLR 6FT .25IN ARGYLE PVC NCDTV STR MALE FEMALE (MED SURG SUPPLIES) ×1
TUBING SUCT CLR 6FT .25IN ARGYLE PVC NCDTV STR MALE FEMALE MLD CONN STRL LF (MED SURG SUPPLIES) ×1 IMPLANT
WATER STRL 1000ML PRSV FR N-PYRG DEHP-FR PLASTIC PR BTL AQLT (MED SURG SUPPLIES) ×1
WATER STRL 1000ML PRSV FR N-PYRG DEHP-FR PLASTIC PR BTL AQLT LF (MED SURG SUPPLIES) ×1 IMPLANT

## 2021-10-01 NOTE — OR Surgeon (Signed)
[facility]  Operative Note     PATIENT NAME:  Laura Oconnell, Laura Oconnell  MRN:  [id number]  DOB:  16-Feb-1951    Date of Procedure:  10/01/2021  Preoperative Diagnosis: SQUAMOUS CELL CARCINOMA LEFT FOREHEAD   Postoperative Diagnoses:  SQUAMOUS CELL CARCINOMA LEFT FOREHEAD   Procedure Performed: Procedure(s):  REPAIR OF MOHS LEFT FOREHEAD DEFECT WITH FLAP CLOSURE 30CM2 TISSUE REARRANGEMENT AND A TO T FLAP CLOSURE  Surgeon: Dia Sitter, DO   Anesthesia: CRNA: Sharen Hones, CRNA   OR Staff: Circulator: Rosalyn Gess, RN  Scrub First Assist: Leta Jungling, ST   Estimated Blood Loss:  Minimal  Specimens: * No specimens in log *   Complications: None immediate  Indications For Procedure:  As per the above aforementioned diagnoses    Laura Oconnell  is a very pleasant  71 y.o. female  presenting  for [place] Risks, benefits, indications, and complications of procedure were discussed, and informed signed consent obtained.  Description of Procedure:  Patient was identified in the preoperative holding area but name medical record number date.  Patient brought back to operating room.  Timeout was taken to ensure correct patient and procedure.  MAC anesthesia was administered.  Head of the bed was turned 90 to the right.  Patient was then prepped and draped in sterile ENT fashion.  1% lidocaine was injected around the wound.  Patient had a defect located [left forehead defec].   Wide undermining was performed with combination of 15 blade and supersharp scissors with the use of skin hook for retraction, tissue underminding and rearrangement was performed for a total of [30] cm2.  Standing can take cutaneous deformities were removed superiorly and inferiorly, back cuts were made inferiorly along the orbital rim.  Surrounding tissues were then advanced in an A to T fashion.  Layered closure was then performed with 4-0 Biosyn being used to approximate the deep  tissues and 6-0 Prolene in a running fashion was used to approximate the skin.  Antibiotic ointment then was applied to the incision patient was awakened from anesthesia tolerated procedure without complication  Dia Sitter, DO    This note was partially generated using MModal Fluency Direct system, and there may be some incorrect words, spellings, and punctuation that were not noted in checking the note before saving.

## 2021-10-01 NOTE — Anesthesia Postprocedure Evaluation (Signed)
Anesthesia Post Op Evaluation    Patient: CLAUDEAN LEAVELLE  Procedure(s):  REPAIR OF MOHS LEFT FOREHEAD DEFECT WITH FLAP CLOSURE    Last Vitals:Temperature: 37.1 C (98.7 F) (10/01/21 0857)  Heart Rate: 76 (10/01/21 0857)  BP (Non-Invasive): 117/80 (10/01/21 0857)  Respiratory Rate: 16 (10/01/21 0857)  SpO2: 100 % (10/01/21 0857)    There were no known notable events for this encounter.    Patient is sufficiently recovered from the effects of anesthesia to participate in the evaluation and has returned to their pre-procedure level.  Patient location during evaluation: PACU       Patient participation: complete - patient participated  Level of consciousness: awake and alert and responsive to verbal stimuli    Pain management: adequate  Airway patency: patent    Anesthetic complications: no  Cardiovascular status: acceptable  Respiratory status: acceptable  Hydration status: acceptable  Patient post-procedure temperature: Pt Normothermic   PONV Status: Absent

## 2021-10-01 NOTE — Nurses Notes (Signed)
Both Daughter and pt states that she has COPD. States that she should wear O2 but she doesn't. The daughter stated her O2 sat has always been in the 80's-90's. Encouraged to wear it.

## 2021-10-01 NOTE — Interval H&P Note (Signed)
[facility]      H&P UPDATE FORM                                                                                  Laura Oconnell, Laura Oconnell, 71 y.o. female  Date of Admission:  10/01/2021  Date of Birth:  1951/03/03    10/01/2021    STOP: IF H&P IS GREATER THAN 30 DAYS FROM SURGICAL DAY COMPLETE NEW H&P IS REQUIRED.     H & P updated the day of the procedure.  1.  H&P completed within 30 days of surgical procedure and has been reviewed within 24 hours of admission but prior to surgery or a procedure requiring anesthesia services, the patient has been examined, and no change has occured in the patients condition since the H&P was completed.       Change in medications: No        No LMP recorded. Patient has had a hysterectomy.      Comments:     2.  Patient continues to be appropriate candidate for planned surgical procedure. YES    Dia Sitter, DO

## 2021-10-01 NOTE — Anesthesia Transfer of Care (Signed)
ANESTHESIA TRANSFER OF CARE   Laura Oconnell is a 71 y.o. ,female, Weight: 49.9 kg (110 lb)   had Procedure(s):  REPAIR OF MOHS LEFT FOREHEAD DEFECT WITH FLAP CLOSURE  performed  10/01/21   Primary Service: Dia Sitter, DO    Past Medical History:   Diagnosis Date   . Bronchiectasis with (acute) exacerbation (CMS HCC) 08/03/2021   . Chronic obstructive pulmonary disease with acute exacerbation (CMS HCC) 09/18/2015   . Chronic pain disorder 05/29/2015   . Chronic, continuous use of opioids 08/03/2021   . Closed right hip fracture, initial encounter (CMS [place]) 02/01/2021   . Compression fracture 05/29/2015    Formatting of this note might be different from the original. Per Dr. Merleen Nicely, Neurosurgery; kyphoplasty x 2   . Depression 05/29/2015   . Difficulty with speech 08/03/2021   . Esophageal reflux    . Essential hypertension 05/29/2015   . GAD (generalized anxiety disorder) 05/29/2015   . Gastroesophageal reflux disease without esophagitis 05/29/2015   . Hypocalcemia 05/29/2015   . Hypoglycemia 11/25/2016   . Hyponatremia 08/03/2021   . Intracranial aneurysm 08/03/2021   . Myofascial pain syndrome 06/29/2018   . Neuropathic pain 06/29/2018   . Opioid use agreement exists 06/29/2018   . Osteoporosis 05/29/2015   . 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   . Spasm of back muscles 06/29/2018   . Spondylosis of thoracic region without myelopathy or radiculopathy 06/29/2018      Allergy History as of 10/01/21      No Known Allergies              I completed my transfer of care / handoff to the receiving personnel during which we discussed:  Access, Airway, All key/critical aspects of case discussed, Analgesia, Antibiotics, Expectation of post procedure, Fluids/Product, Gave opportunity for questions and acknowledgement of understanding, Labs and PMHx  Report given to: Hetty Ely, RN    Post Location: PACU                                                            Last OR Temp: Temperature: 37.1 C (98.7 F)  ABG:  POTASSIUM   Date Value Ref Range Status   09/21/2021 4.8 3.5 - 5.1 mmol/L Final     CALCIUM   Date Value Ref Range Status   09/21/2021 9.1 8.6 - 10.3 mg/dL Final     Airway:* No LDAs found *  Blood pressure 117/80, pulse 76, temperature 37.1 C (98.7 F), resp. rate 16, height 1.473 m ('4\' 10"'$ ), weight 49.9 kg (110 lb), SpO2 100 %.

## 2021-10-01 NOTE — Anesthesia Preprocedure Evaluation (Signed)
ANESTHESIA PRE-OP EVALUATION  Planned Procedure: REPAIR OF MOHS LEFT FOREHEAD DEFECT WITH FLAP CLOSURE (Face)  Review of Systems    PONV       patient summary reviewed  nursing notes reviewed        Pulmonary   COPD and past history of smoking ,   Cardiovascular    Hypertension and ECG reviewed ,No peripheral edema,  Exercise Tolerance: > or = 4 METS        GI/Hepatic/Renal    GERD        Endo/Other   neg endo/other ROS,       Neuro/Psych/MS    TIA, fibromyalgia, depression     Cancer  CA,                     Physical Assessment      Airway       Mallampati: I    TM distance: >3 FB    Neck ROM: full  Mouth Opening: good.  No Facial hair  No Beard        Dental           (+) edentulous           Pulmonary    Breath sounds clear to auscultation  (-) no rhonchi, no decreased breath sounds, no wheezes, no rales and no stridor     Cardiovascular    Rhythm: regular  Rate: Normal  (-) no friction rub, carotid bruit is not present, no peripheral edema and no murmur     Other findings            Plan  Patient history with history of intracranial aneurysm. Small in size no signs of increased intracranial pressure.  No recommendations by neurosurgery do treat.  ASA 3     Planned anesthesia type: MAC                     Intravenous induction     Anesthesia issues/risks discussed are: Dental Injuries, Stroke, Sore Throat and Cardiac Events/MI.  Anesthetic plan and risks discussed with patient             Patient's NPO status is appropriate for Anesthesia.

## 2021-10-01 NOTE — Discharge Instructions (Addendum)
If any questions or concerns call the office    Follow any instructions given by Dr Cyndia Diver    Notify Dr Cyndia Diver  of any signs of infection, temp greater than 100.4, any foul smell, any unusual color drainage    Do the cleaning until your return to the office    Follow up with Dr Cyndia Diver July 13 at 1 pm    Clean incision four times a day with saline and apply antibiotic ointment provided after each cleaning    Follow any post op instructions given by Dr Cyndia Diver    Call for problems, questions, concerns.    You may shower but do not let the shower stream strike the incision.    Resume home meds.    Rx for home: Keflex 500 mg # 21

## 2021-10-14 ENCOUNTER — Ambulatory Visit (INDEPENDENT_AMBULATORY_CARE_PROVIDER_SITE_OTHER): Payer: Medicare Other | Admitting: OTOLARYNGOLOGY

## 2021-10-14 ENCOUNTER — Other Ambulatory Visit: Payer: Self-pay

## 2021-10-14 ENCOUNTER — Encounter (INDEPENDENT_AMBULATORY_CARE_PROVIDER_SITE_OTHER): Payer: Self-pay | Admitting: OTOLARYNGOLOGY

## 2021-10-14 VITALS — Ht <= 58 in | Wt 110.0 lb

## 2021-10-14 DIAGNOSIS — D0439 Carcinoma in situ of skin of other parts of face: Secondary | ICD-10-CM

## 2021-10-14 NOTE — H&P (Signed)
[facility]  ENT, [place]    Progress Note    Name: Laura Oconnell MRN:  [id number]   Date: 10/14/2021 Age: 71 y.o.          Follow Up      Subjective:   Chief Complaint:   Post Op (10 day po)       History of Present Illness:  Laura Oconnell is a 71 y.o. old female who presents to the clinic for follow-up. Doing well , no complaints.     Review of Systems    Physical Exam:     Vitals:    10/14/21 1306   Weight: 49.9 kg (110 lb)   Height: 1.473 m ('4\' 10"'$ )   BMI: 23.04      ENT Physical Exam   Incision CDI      Assessment and Plan:       ICD-10-CM    1. 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    Dia Sitter, DO

## 2021-11-01 ENCOUNTER — Other Ambulatory Visit: Payer: Self-pay

## 2021-12-07 ENCOUNTER — Other Ambulatory Visit: Payer: Self-pay

## 2021-12-07 ENCOUNTER — Ambulatory Visit (INDEPENDENT_AMBULATORY_CARE_PROVIDER_SITE_OTHER): Payer: Medicare Other | Admitting: OTOLARYNGOLOGY

## 2021-12-07 ENCOUNTER — Encounter (INDEPENDENT_AMBULATORY_CARE_PROVIDER_SITE_OTHER): Payer: Self-pay | Admitting: OTOLARYNGOLOGY

## 2021-12-07 VITALS — Ht <= 58 in | Wt 110.0 lb

## 2021-12-07 DIAGNOSIS — D0439 Carcinoma in situ of skin of other parts of face: Secondary | ICD-10-CM

## 2021-12-07 DIAGNOSIS — L988 Other specified disorders of the skin and subcutaneous tissue: Secondary | ICD-10-CM

## 2021-12-07 DIAGNOSIS — L989 Disorder of the skin and subcutaneous tissue, unspecified: Secondary | ICD-10-CM

## 2021-12-07 NOTE — H&P (Signed)
ENT, [place]  [place] [address]  Phone: [phone]  Fax: [phone]      Encounter Date: 12/07/2021    Patient ID: Laura Oconnell  MRN: [id number]    DOB: 1950-10-10  Age: 71 y.o. female     Progress Note       Referring Provider:  No ref. provider found    Reason for Visit:   Chief Complaint   Patient presents with    Follow Up 3 Months     3 month rc on skin        History of Present Illness:  Laura Oconnell is a 71 y.o. 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 She is doing well. States she feels a stitch poking up.       Patient History:  Patient Active Problem List   Diagnosis    Bronchiectasis with (acute) exacerbation (CMS HCC)    Chronic obstructive pulmonary disease with acute exacerbation (CMS HCC)    Chronic pain disorder    Chronic, continuous use of opioids    Closed right hip fracture, initial encounter (CMS [facility] [place])    Compression fracture    Depression    Difficulty with speech    Essential hypertension    GAD (generalized anxiety disorder)    Gastroesophageal reflux disease without esophagitis    Hypocalcemia    Hypoglycemia    Hyponatremia    Intracranial aneurysm    Myofascial pain syndrome    Neuropathic pain    Opioid use agreement exists    Osteoporosis    Other specified alveolar and parietoalveolar pneumonopathies    Spasm of back muscles    Spondylosis of thoracic region without myelopathy or radiculopathy    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    Acute postoperative anemia due to expected blood loss    Blood loss anemia    Encounter for rehabilitation    Restless leg syndrome     Current Outpatient Medications   Medication Sig    baclofen (LIORESAL) 10 mg Oral Tablet Take 1 Tablet (10 mg total) by mouth Three times a day as needed    baclofen (LIORESAL) 5 mg Oral Tablet Take 1 Tablet (5 mg total) by mouth Three times a day as needed    Calcium-Cholecalciferol, D3, 600 mg-5 mcg (200 unit) Oral Capsule Take 1 Capsule by mouth  Once a day    COPPER ORAL Take 2 mg by mouth Once a day    Diltiazem HCl (TIAZAC) 120 mg Oral Capsule,Sustained Action 24 hr Take 1 Capsule (120 mg total) by mouth Once a day    ferrous sulfate (FERATAB) 324 mg (65 mg iron) Oral Tablet, Delayed Release (E.C.) Take 1 Tablet (324 mg total) by mouth Once a day    folic acid (FOLVITE) 1 mg Oral Tablet Take 2 Tablets (2 mg total) by mouth Once a day    furosemide (LASIX) 20 mg Oral Tablet Take 1 Tablet (20 mg total) by mouth Once a day    gabapentin (NEURONTIN) 800 mg Oral Tablet Take 1 Tablet (800 mg total) by mouth Three times a day    HYDROcodone-acetaminophen (NORCO) 5-325 mg Oral Tablet Take 1 Tablet by mouth Every 6 hours as needed    memantine (NAMENDA) 10 mg Oral Tablet 1 Tablet (10 mg total) Twice daily    omeprazole (PRILOSEC) 20 mg Oral Capsule, Delayed Release(E.C.) Take 1 Capsule (20 mg total) by mouth Once  a day    oxyCODONE (OXYCONTIN) 10 mg Oral tablet,oral only,ext.rel.12 hr Take 1 Tablet (10 mg total) by mouth Every 12 hours    PARoxetine (PAXIL) 40 mg Oral Tablet Take 1 Tablet (40 mg total) by mouth Every morning    rOPINIRole (REQUIP) 0.5 mg Oral Tablet Take 1 Tablet (0.5 mg total) by mouth Every night as needed    tiotropium bromide (SPIRIVA HANDIHALER) 18 mcg Inhalation Capsule, w/Inhalation Device Take 1 Capsule (18 mcg total) by inhalation Once a day      No Known Allergies  Past Medical History:   Diagnosis Date    Bronchiectasis with (acute) exacerbation (CMS HCC) 08/03/2021    Chronic obstructive pulmonary disease with acute exacerbation (CMS HCC) 09/18/2015    Chronic pain disorder 05/29/2015    Chronic, continuous use of opioids 08/03/2021    Closed right hip fracture, initial encounter (CMS [facility]) 02/01/2021    Compression fracture 05/29/2015    Formatting of this note might be different from the original. Per Dr. Merleen Nicely, Neurosurgery; kyphoplasty x 2    Depression 05/29/2015    Difficulty with speech 08/03/2021    Esophageal reflux     Essential  hypertension 05/29/2015    GAD (generalized anxiety disorder) 05/29/2015    Gastroesophageal reflux disease without esophagitis 05/29/2015    Hypocalcemia 05/29/2015    Hypoglycemia 11/25/2016    Hyponatremia 08/03/2021    Intracranial aneurysm 08/03/2021    Myofascial pain syndrome 06/29/2018    Neuropathic pain 06/29/2018    Opioid use agreement exists 06/29/2018    Osteoporosis 05/29/2015    Other specified alveolar and parietoalveolar pneumonopathies 07/05/2005    Formatting of this note might be different from the original.    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     History reviewed. No pertinent surgical history.  Family Medical History:    None         Social History     Tobacco Use    Smoking status: Former     Packs/day: 2.00     Years: 42.00     Pack years: 84.00     Types: Cigarettes     Quit date: 2008     Years since quitting: 15.6    Smokeless tobacco: Never   Vaping Use    Vaping Use: Never used   Substance Use Topics    Drug use: Never       Review of Systems:  Review of Systems    Physical Exam:  Ht 1.473 m ('4\' 10"'$ )   Wt 49.9 kg (110 lb)   BMI 22.99 kg/m       Physical Exam  Constitutional:       Appearance: Normal appearance. She is well-developed, well-groomed and normal weight.   HENT:      Head: Normocephalic and atraumatic.      Comments: L forehead WHSS  Spitting suture along incision     Right Ear: Hearing, tympanic membrane, ear canal and external ear normal.      Left Ear: Hearing, tympanic membrane, ear canal and external ear normal.      Nose: Septal deviation and mucosal edema present.      Right Turbinates: Enlarged.      Left Turbinates: Enlarged.      Mouth/Throat:      Lips: Pink.      Mouth: Mucous membranes are moist.      Pharynx: Oropharynx is clear.  Uvula midline.   Eyes:      Extraocular Movements: Extraocular movements intact.   Neck:      Trachea: Phonation normal.   Pulmonary:      Effort: Pulmonary effort is  normal.   Musculoskeletal:      Cervical back: Normal range of motion and neck supple.   Lymphadenopathy:      Cervical: No cervical adenopathy.   Skin:     General: Skin is warm.   Neurological:      Mental Status: She is alert and oriented to person, place, and time.      Cranial Nerves: Cranial nerves 2-12 are intact. No facial asymmetry.   Psychiatric:         Attention and Perception: Attention normal.         Mood and Affect: Mood normal.         Speech: Speech normal.         Behavior: Behavior normal. Behavior is cooperative.       Assessment:  ENCOUNTER DIAGNOSES     ICD-10-CM   1. 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 Arm skin lesion, right  L98.9       Plan:  Medical records reviewed on 12/07/2021.    Orders Placed This Encounter    11301 - SHAVING OF EPIDERMAL/DERMAL LESION, SINGLE LESION, 0.6 TO 1.0 CM TRUNK, ARMS OR LEGS (AMB ONLY)     Return in about 2 weeks (around 12/21/2021).    Mathis Dad, PA-C  12/07/2021, 11:16   The advanced practice clinician's documentation was reviewed/amended in its entirety with the assessment and plan portion completely performed independently by me during this separate encounter.

## 2021-12-07 NOTE — Procedures (Signed)
ENT, [place]  [place] [address]    Procedure Note    Name: Laura Oconnell MRN:  [id number]   Date: 12/07/2021 Age: 71 y.o.       11301 - SHAVING OF EPIDERMAL/DERMAL LESION, SINGLE LESION, 0.6 TO 1.0 CM TRUNK, ARMS OR LEGS (AMB ONLY)  Performed by: Dia Sitter, DO  Authorized by: Dia Sitter, DO     Time Out:     Immediately before the procedure, a time out was called:  Yes    Patient verified:  Yes    Procedure Verified:  Yes    Site Verified:  Yes  Documentation:      Shave biopsy performed with flexible razor of right arm lesion of 1cm under local using 1 % lidocaine with epi.  Hemostasis achieved with Drysol.  Patient tolerated procedure without complication.  Specimen was sent to pathology       Dia Sitter, DO

## 2021-12-23 ENCOUNTER — Other Ambulatory Visit: Payer: Self-pay

## 2021-12-23 ENCOUNTER — Encounter (INDEPENDENT_AMBULATORY_CARE_PROVIDER_SITE_OTHER): Payer: Self-pay | Admitting: OTOLARYNGOLOGY

## 2021-12-23 ENCOUNTER — Ambulatory Visit (INDEPENDENT_AMBULATORY_CARE_PROVIDER_SITE_OTHER): Payer: Medicare Other | Admitting: OTOLARYNGOLOGY

## 2021-12-23 VITALS — Wt 110.0 lb

## 2021-12-23 DIAGNOSIS — L989 Disorder of the skin and subcutaneous tissue, unspecified: Secondary | ICD-10-CM

## 2021-12-23 DIAGNOSIS — C44621 Squamous cell carcinoma of skin of unspecified upper limb, including shoulder: Secondary | ICD-10-CM

## 2021-12-23 DIAGNOSIS — D0439 Carcinoma in situ of skin of other parts of face: Secondary | ICD-10-CM

## 2021-12-23 NOTE — H&P (Signed)
[place]  ENT, [place]    Progress Note    Name: Laura Oconnell MRN:  [id number]   Date: 12/23/2021 Age: 71 y.o.          Follow Up      Subjective:   Chief Complaint:   Skin  Lesion (Patient here for 2 week follow up on right arm bx)       History of Present Illness:  Laura Oconnell is a 71 y.o. old female who presents to the clinic for follow-up after biopsy of the arm. Pathology results are below.       Review of Systems     Physical Exam:     Vitals:    12/23/21 1439   Weight: 49.9 kg (110 lb)      ENT Physical Exam  Constitutional  Appearance: patient appears well-developed, well-nourished and well-groomed,  Communication/Voice: communication appropriate for developmental age; vocal quality normal;  Head and Face  Appearance: head appears normal, face appears normal and face appears atraumatic;  Palpation: facial palpation normal;  Salivary: glands normal;  Ear  Hearing: intact;  Auricles: right auricle normal; left auricle normal;  External Mastoids: right external mastoid normal; left external mastoid normal;  Ear Canals: right ear canal normal; left ear canal normal;  Tympanic Membranes: right tympanic membrane normal; left tympanic membrane normal;  Nose  External Nose: nares patent bilaterally; external nose normal;  Internal Nose: nasal mucosa normal; septum normal; bilateral inferior turbinates normal;  Oral Cavity/Oropharynx  Lips: normal;  Teeth: normal;  Gums: gingiva normal;  Tongue: normal;  Oral mucosa: normal;  Hard palate: normal;  Neck  Neck: neck normal; neck palpation normal;  Thyroid: thyroid normal;  Respiratory  Inspection: breathing unlabored; normal breathing rate;  Lymphatic  Palpation: lymph nodes normal;  Neurovestibular  Mental Status: alert and oriented;  Psychiatric: mood normal; affect is appropriate;  Cranial Nerves: cranial nerves intact;     Assessment and Plan:       ICD-10-CM    1. 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 Arm skin  lesion, right  L98.9       3. 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  Patient wishes to try cyrotherapy. Will refer for cyrtotherapy.     Follow up:  Return in about 3 months (around 03/24/2022).    Dia Sitter, DO

## 2021-12-23 NOTE — Addendum Note (Signed)
Addended by: Leeroy Cha on: 12/23/2021 03:13 PM     Modules accepted: Orders

## 2022-03-15 ENCOUNTER — Emergency Department (HOSPITAL_COMMUNITY): Payer: Medicare Other

## 2022-03-15 ENCOUNTER — Inpatient Hospital Stay (HOSPITAL_COMMUNITY): Payer: Medicare Other | Admitting: Internal Medicine

## 2022-03-15 ENCOUNTER — Encounter (HOSPITAL_COMMUNITY): Payer: Self-pay

## 2022-03-15 ENCOUNTER — Inpatient Hospital Stay
Admission: EM | Admit: 2022-03-15 | Discharge: 2022-03-19 | DRG: 177 | Disposition: A | Discharge status: Home / Self Care | Payer: Medicare Other | Attending: Internal Medicine | Admitting: Internal Medicine

## 2022-03-15 ENCOUNTER — Other Ambulatory Visit: Payer: Self-pay

## 2022-03-15 DIAGNOSIS — M810 Age-related osteoporosis without current pathological fracture: Secondary | ICD-10-CM | POA: Diagnosis present

## 2022-03-15 DIAGNOSIS — F17210 Nicotine dependence, cigarettes, uncomplicated: Secondary | ICD-10-CM | POA: Diagnosis present

## 2022-03-15 DIAGNOSIS — J9621 Acute and chronic respiratory failure with hypoxia: Secondary | ICD-10-CM | POA: Diagnosis present

## 2022-03-15 DIAGNOSIS — R200 Anesthesia of skin: Secondary | ICD-10-CM

## 2022-03-15 DIAGNOSIS — R4182 Altered mental status, unspecified: Secondary | ICD-10-CM

## 2022-03-15 DIAGNOSIS — G894 Chronic pain syndrome: Secondary | ICD-10-CM | POA: Diagnosis present

## 2022-03-15 DIAGNOSIS — B379 Candidiasis, unspecified: Secondary | ICD-10-CM | POA: Diagnosis not present

## 2022-03-15 DIAGNOSIS — K219 Gastro-esophageal reflux disease without esophagitis: Secondary | ICD-10-CM | POA: Diagnosis present

## 2022-03-15 DIAGNOSIS — J9601 Acute respiratory failure with hypoxia: Secondary | ICD-10-CM

## 2022-03-15 DIAGNOSIS — J449 Chronic obstructive pulmonary disease, unspecified: Secondary | ICD-10-CM | POA: Diagnosis present

## 2022-03-15 DIAGNOSIS — J1282 Pneumonia due to coronavirus disease 2019: Secondary | ICD-10-CM | POA: Diagnosis present

## 2022-03-15 DIAGNOSIS — J479 Bronchiectasis, uncomplicated: Secondary | ICD-10-CM | POA: Diagnosis present

## 2022-03-15 DIAGNOSIS — Z66 Do not resuscitate: Secondary | ICD-10-CM | POA: Diagnosis present

## 2022-03-15 DIAGNOSIS — G929 Unspecified toxic encephalopathy: Secondary | ICD-10-CM | POA: Diagnosis present

## 2022-03-15 DIAGNOSIS — J9600 Acute respiratory failure, unspecified whether with hypoxia or hypercapnia: Secondary | ICD-10-CM | POA: Diagnosis present

## 2022-03-15 DIAGNOSIS — J441 Chronic obstructive pulmonary disease with (acute) exacerbation: Secondary | ICD-10-CM | POA: Diagnosis present

## 2022-03-15 DIAGNOSIS — U071 COVID-19: Principal | ICD-10-CM | POA: Diagnosis present

## 2022-03-15 DIAGNOSIS — F32A Depression, unspecified: Secondary | ICD-10-CM

## 2022-03-15 DIAGNOSIS — G629 Polyneuropathy, unspecified: Secondary | ICD-10-CM

## 2022-03-15 DIAGNOSIS — I10 Essential (primary) hypertension: Secondary | ICD-10-CM | POA: Diagnosis present

## 2022-03-15 DIAGNOSIS — F411 Generalized anxiety disorder: Secondary | ICD-10-CM

## 2022-03-15 DIAGNOSIS — Z79891 Long term (current) use of opiate analgesic: Secondary | ICD-10-CM

## 2022-03-15 LAB — MANUAL DIFFERENTIAL
EOSINOPHIL %: 1 % (ref 0–7)
EOSINOPHIL ABSOLUTE: 0.01 10*3/uL (ref 0.00–0.80)
EOSINOPHILS MANUAL: 1
LYMPHOCYTE %: 19 % — ABNORMAL LOW (ref 25–45)
LYMPHOCYTE ABSOLUTE: 0.27 10*3/uL — ABNORMAL LOW (ref 1.10–5.00)
LYMPHOCYTES MANUAL: 19
MONOCYTE %: 9 % (ref 0–12)
MONOCYTE ABSOLUTE: 0.13 10*3/uL (ref 0.00–1.30)
MONOCYTES MANUAL: 9
NEUTROPHIL %: 71 % (ref 40–76)
NEUTROPHIL ABSOLUTE: 0.99 10*3/uL — ABNORMAL LOW (ref 1.80–8.40)
NEUTROPHILS MANUAL: 71
PLATELET MORPHOLOGY COMMENT: DECREASED
RBC MORPHOLOGY COMMENT: NORMAL
TOTAL CELLS COUNTED [#] IN BLOOD: 100
WBC: 1.4 10*3/uL

## 2022-03-15 LAB — D-DIMER: D-DIMER: 1160 ng/mL FEU (ref 215–500)

## 2022-03-15 LAB — COMPREHENSIVE METABOLIC PANEL, NON-FASTING
ALBUMIN/GLOBULIN RATIO: 1.2 (ref 0.8–1.4)
ALBUMIN: 3.5 g/dL (ref 3.5–5.7)
ALKALINE PHOSPHATASE: 68 U/L (ref 34–104)
ALT (SGPT): 11 U/L (ref 7–52)
ANION GAP: 8 mmol/L (ref 4–13)
AST (SGOT): 16 U/L (ref 13–39)
BILIRUBIN TOTAL: 0.2 mg/dL — ABNORMAL LOW (ref 0.3–1.2)
BUN/CREA RATIO: 31 — ABNORMAL HIGH (ref 6–22)
BUN: 29 mg/dL — ABNORMAL HIGH (ref 7–25)
CALCIUM, CORRECTED: 8.6 mg/dL — ABNORMAL LOW (ref 8.9–10.8)
CALCIUM: 8.2 mg/dL — ABNORMAL LOW (ref 8.6–10.3)
CHLORIDE: 111 mmol/L — ABNORMAL HIGH (ref 98–107)
CO2 TOTAL: 27 mmol/L (ref 21–31)
CREATININE: 0.94 mg/dL (ref 0.60–1.30)
ESTIMATED GFR: 64 mL/min/{1.73_m2} (ref >59)
GLOBULIN: 2.9 (ref 2.9–5.4)
GLUCOSE: 109 mg/dL (ref 74–109)
OSMOLALITY, CALCULATED: 297 mOsm/kg — ABNORMAL HIGH (ref 270–290)
POTASSIUM: 3.4 mmol/L — ABNORMAL LOW (ref 3.5–5.1)
PROTEIN TOTAL: 6.4 g/dL (ref 6.4–8.9)
SODIUM: 146 mmol/L — ABNORMAL HIGH (ref 136–145)

## 2022-03-15 LAB — URINALYSIS, MACROSCOPIC
BILIRUBIN: NEGATIVE mg/dL
BLOOD: NEGATIVE mg/dL
GLUCOSE: NEGATIVE mg/dL
KETONES: NEGATIVE mg/dL
LEUKOCYTES: NEGATIVE WBCs/uL
NITRITE: NEGATIVE
PH: 6 (ref 5.0–9.0)
PROTEIN: NEGATIVE mg/dL
SPECIFIC GRAVITY: 1.011 (ref 1.002–1.030)
UROBILINOGEN: NORMAL mg/dL

## 2022-03-15 LAB — CBC WITH DIFF
HCT: 30.4 % — ABNORMAL LOW (ref 31.2–41.9)
HGB: 10.3 g/dL — ABNORMAL LOW (ref 10.9–14.3)
MCH: 29.3 pg (ref 24.7–32.8)
MCHC: 33.9 g/dL (ref 32.3–35.6)
MCV: 86.2 fL (ref 75.5–95.3)
MPV: 7.1 fL — ABNORMAL LOW (ref 7.9–10.8)
PLATELETS: 94 10*3/uL — ABNORMAL LOW (ref 140–440)
RBC: 3.53 10*6/uL — ABNORMAL LOW (ref 3.63–4.92)
RDW: 15.2 % (ref 12.3–17.7)
WBC: 1.4 10*3/uL — CL (ref 3.8–11.8)

## 2022-03-15 LAB — ARTERIAL BLOOD GAS/LACTATE
%FIO2 (ARTERIAL): 21 %
BASE EXCESS (ARTERIAL): 1.9 mmol/L (ref 0.0–2.0)
BICARBONATE (ARTERIAL): 25.3 mmol/L (ref 20.0–26.0)
CARBOXYHEMOGLOBIN: 1.4 % (ref <=1.5)
LACTATE: 0.6 mmol/L (ref <=2.0)
MET-HEMOGLOBIN: 0.4 % (ref <=2.0)
O2CT: 14.1 %
OXYHEMOGLOBIN: 84.6 % — CL (ref 88.0–100.0)
PCO2 (ARTERIAL): 48 mm/Hg — ABNORMAL HIGH (ref 35–45)
PH (ARTERIAL): 7.36 (ref 7.35–7.45)
PO2 (ARTERIAL): 53 mm/Hg — ABNORMAL LOW (ref 80–100)

## 2022-03-15 LAB — COVID-19, FLU A/B, RSV RAPID BY PCR
INFLUENZA VIRUS TYPE A: NOT DETECTED
INFLUENZA VIRUS TYPE B: NOT DETECTED
RESPIRATORY SYNCTIAL VIRUS (RSV): NOT DETECTED
SARS-CoV-2: DETECTED — AB

## 2022-03-15 LAB — URINE DRUG SCREEN
AMPHET QL: NEGATIVE
BARB QL: NEGATIVE
BENZO QL: NEGATIVE
BUP QL: NEGATIVE
CANNAQL: NEGATIVE
COCQL: NEGATIVE
FENTANYL, RANDOM URINE: NEGATIVE
METHQL: NEGATIVE
OPIATE: POSITIVE — AB
OXYCODONE URINE: POSITIVE — AB
PCP QL: NEGATIVE

## 2022-03-15 LAB — LACTIC ACID LEVEL W/ REFLEX FOR LEVEL >2.0: LACTIC ACID: 0.5 mmol/L (ref 0.5–2.2)

## 2022-03-15 LAB — ETHANOL, SERUM/PLASMA: ETHANOL: <10 mg/dL — ABNORMAL HIGH

## 2022-03-15 LAB — URINALYSIS, MICROSCOPIC
RBCS: <1 /hpf (ref <4)
WBCS: <1 /hpf (ref <6)

## 2022-03-15 MED ORDER — POTASSIUM CHLORIDE ER 20 MEQ TABLET,EXTENDED RELEASE(PART/CRYST)
ORAL_TABLET | ORAL | Status: AC
Start: 2022-03-15 — End: 2022-03-15
  Filled 2022-03-15: qty 1

## 2022-03-15 MED ORDER — NALOXONE 1 MG/ML INJECTION SYRINGE
INJECTION | INTRAMUSCULAR | Status: AC
Start: 2022-03-15 — End: 2022-03-15
  Filled 2022-03-15: qty 2

## 2022-03-15 MED ORDER — SODIUM CHLORIDE 0.9 % INTRAVENOUS PIGGYBACK
100.0000 mg | INTRAVENOUS | Status: AC
Start: 2022-03-15 — End: 2022-03-15
  Administered 2022-03-15: 100 mg via INTRAVENOUS
  Administered 2022-03-15: 0 mg via INTRAVENOUS

## 2022-03-15 MED ORDER — IOHEXOL 350 MG IODINE/ML INTRAVENOUS SOLUTION
100.0000 mL | INTRAVENOUS | Status: AC
Start: 2022-03-15 — End: 2022-03-15
  Administered 2022-03-15: 100 mL via INTRAVENOUS

## 2022-03-15 MED ORDER — SODIUM CHLORIDE 0.9 % INTRAVENOUS SOLUTION
100.0000 mg | INTRAVENOUS | Status: AC
Start: 2022-03-16 — End: 2022-03-19
  Administered 2022-03-16 – 2022-03-17 (×2): 100 mg via INTRAVENOUS
  Administered 2022-03-17 – 2022-03-18 (×3): 0 mg via INTRAVENOUS
  Administered 2022-03-18 – 2022-03-19 (×2): 100 mg via INTRAVENOUS
  Administered 2022-03-19: 0 mg via INTRAVENOUS
  Filled 2022-03-15 (×4): qty 20

## 2022-03-15 MED ORDER — SODIUM CHLORIDE 0.9 % INTRAVENOUS PIGGYBACK
INJECTION | INTRAVENOUS | Status: AC
Start: 2022-03-15 — End: 2022-03-15
  Filled 2022-03-15: qty 100

## 2022-03-15 MED ORDER — DOXYCYCLINE HYCLATE 100 MG INTRAVENOUS POWDER FOR SOLUTION
INTRAVENOUS | Status: AC
Start: 2022-03-15 — End: 2022-03-15
  Filled 2022-03-15: qty 10

## 2022-03-15 MED ORDER — SODIUM CHLORIDE 0.9 % INTRAVENOUS PIGGYBACK
1.0000 g | INTRAVENOUS | Status: AC
Start: 2022-03-15 — End: 2022-03-15
  Administered 2022-03-15: 0 g via INTRAVENOUS
  Administered 2022-03-15: 1 g via INTRAVENOUS

## 2022-03-15 MED ORDER — CEFTRIAXONE 1 GRAM SOLUTION FOR INJECTION
INTRAMUSCULAR | Status: AC
Start: 2022-03-15 — End: 2022-03-15
  Filled 2022-03-15: qty 10

## 2022-03-15 MED ORDER — POTASSIUM CHLORIDE ER 20 MEQ TABLET,EXTENDED RELEASE(PART/CRYST)
20.0000 meq | ORAL_TABLET | ORAL | Status: AC
Start: 2022-03-15 — End: 2022-03-15
  Administered 2022-03-15: 20 meq via ORAL

## 2022-03-15 MED ORDER — NALOXONE 1 MG/ML INJECTION SYRINGE
2.0000 mg | INJECTION | INTRAMUSCULAR | Status: AC
Start: 2022-03-15 — End: 2022-03-15
  Administered 2022-03-15: 2 mg via INTRAVENOUS

## 2022-03-15 MED ORDER — DOXYCYCLINE HYCLATE 100 MG TABLET
ORAL_TABLET | ORAL | Status: AC
Start: 2022-03-15 — End: 2022-03-15
  Filled 2022-03-15: qty 1

## 2022-03-15 MED ORDER — DOXYCYCLINE HYCLATE 100 MG TABLET
100.0000 mg | ORAL_TABLET | Freq: Two times a day (BID) | ORAL | Status: DC
Start: 2022-03-15 — End: 2022-03-19
  Administered 2022-03-15 – 2022-03-19 (×8): 100 mg via ORAL
  Filled 2022-03-15 (×4): qty 1

## 2022-03-15 MED ORDER — DEXAMETHASONE SODIUM PHOSPHATE (PF) 10 MG/ML INJECTION SOLUTION
6.0000 mg | Freq: Every day | INTRAMUSCULAR | Status: DC
Start: 2022-03-15 — End: 2022-03-19
  Administered 2022-03-15 – 2022-03-19 (×4): 6 mg via INTRAVENOUS
  Filled 2022-03-15 (×2): qty 1

## 2022-03-15 MED ORDER — SODIUM CHLORIDE 0.9 % INTRAVENOUS SOLUTION
200.0000 mg | Freq: Once | INTRAVENOUS | Status: AC
Start: 2022-03-15 — End: 2022-03-16
  Administered 2022-03-15: 200 mg via INTRAVENOUS
  Administered 2022-03-16: 0 mg via INTRAVENOUS
  Filled 2022-03-15: qty 40

## 2022-03-15 MED ORDER — SODIUM CHLORIDE 0.9 % INTRAVENOUS PIGGYBACK
1.0000 g | INTRAVENOUS | Status: DC
Start: 2022-03-16 — End: 2022-03-19
  Administered 2022-03-16: 1 g via INTRAVENOUS
  Administered 2022-03-16: 0 g via INTRAVENOUS
  Administered 2022-03-17: 1 g via INTRAVENOUS
  Administered 2022-03-17 – 2022-03-18 (×2): 0 g via INTRAVENOUS
  Administered 2022-03-18: 1 g via INTRAVENOUS
  Filled 2022-03-15 (×2): qty 10

## 2022-03-15 MED ORDER — DEXAMETHASONE SODIUM PHOSPHATE (PF) 10 MG/ML INJECTION SOLUTION
INTRAMUSCULAR | Status: AC
Start: 2022-03-15 — End: 2022-03-15
  Filled 2022-03-15: qty 1

## 2022-03-15 MED ORDER — SODIUM CHLORIDE 0.9 % INTRAVENOUS PIGGYBACK
INJECTION | INTRAVENOUS | Status: AC
Start: 2022-03-15 — End: 2022-03-15
  Filled 2022-03-15: qty 50

## 2022-03-15 MED ORDER — ALBUTEROL SULFATE HFA 90 MCG/ACTUATION AEROSOL INHALER
2.0000 | INHALATION_SPRAY | RESPIRATORY_TRACT | Status: DC | PRN
Start: 2022-03-15 — End: 2022-03-19

## 2022-03-15 MED ORDER — HEPARIN (PORCINE) 5,000 UNIT/ML INJECTION SOLUTION
5000.0000 [IU] | Freq: Three times a day (TID) | INTRAMUSCULAR | Status: DC
Start: 2022-03-15 — End: 2022-03-15

## 2022-03-15 MED ORDER — HYDROMORPHONE 2 MG/ML INJECTION WRAPPER
1.0000 mg | INJECTION | INTRAMUSCULAR | Status: DC
Start: 2022-03-15 — End: 2022-03-15

## 2022-03-15 NOTE — ED Attending Handoff Note (Signed)
[facility]  Emergency Department  Course Note    Patient Name: Laura Oconnell  Age and Gender: 71 y.o. female  Date of Birth: December 25, 1950  Date of Service: 03/15/2022  MRN: [id number]  PCP: Octaviano Batty, MD    After a thorough discussion of the patient I have assumed care of Laura Oconnell from Dr. Vicenta Aly at 19:30    VS:  Temperature: 36.3 C (97.4 F)  Heart Rate: (!) 101  Respiratory Rate: (!) 25  BP (Non-Invasive): (!) 151/74  SpO2: 94 %    DIAGNOSTICS  Labs Ordered/Reviewed   COMPREHENSIVE METABOLIC PANEL, NON-FASTING - Abnormal; Notable for the following components:       Result Value    SODIUM 146 (*)     POTASSIUM 3.4 (*)     CHLORIDE 111 (*)     BUN 29 (*)     BUN/CREA RATIO 31 (*)     CALCIUM 8.2 (*)     BILIRUBIN TOTAL 0.2 (*)     OSMOLALITY, CALCULATED 297 (*)     CALCIUM, CORRECTED 8.6 (*)     All other components within normal limits    Narrative:     Estimated Glomerular Filtration Rate (eGFR) is calculated using the CKD-EPI (2021) equation, intended for patients 46 years of age and older. If gender is not documented or "unknown", there will be no eGFR calculation.     ARTERIAL BLOOD GAS/LACTATE - Abnormal; Notable for the following components:    PCO2 (ARTERIAL) 48 (*)     PO2 (ARTERIAL) 53 (*)     OXYHEMOGLOBIN 84.6 (*)     All other components within normal limits   ETHANOL, SERUM/PLASMA - Abnormal; Notable for the following components:    ETHANOL <10 (*)     All other components within normal limits   URINE DRUG SCREEN - Abnormal; Notable for the following components:    OPIATE Positive (*)     OXYCODONE URINE Positive (*)     All other components within normal limits    Narrative:     Any results reported as "positive" on this urine drug screen are unconfirmed screening results and should be used for medical(i.e.,treatment)purposes only. Unconfirmed screening results must not be used for non-medical purposes (e.g. employment or legal testing). Upon request, all results  reported as "positive" can be sent to a reference laboratory for confirmation by [place].     Reporting Limits (cut-off concentrations)     Cocaine 300 ng/mL  Opiates 300 ng/mL  THC 50 ng/mL  Amphetamine 1000 ng/mL  Phencyclidine 25 ng/mL  Benzodiazepine 300 ng/mL  Barbiturates 300 ng/mL  Methadone 300 ng/mL  Oxycodone 100 ng/mL  Buprenorphine 5 ng/mL  Fentanyl 5 ng/mL     CBC WITH DIFF - Abnormal; Notable for the following components:    WBC 1.4 (*)     RBC 3.53 (*)     HGB 10.3 (*)     HCT 30.4 (*)     PLATELETS 94 (*)     MPV 7.1 (*)     All other components within normal limits   MANUAL DIFFERENTIAL - Abnormal; Notable for the following components:    LYMPHOCYTE % 19 (*)     NEUTROPHIL ABSOLUTE 0.99 (*)     LYMPHOCYTE ABSOLUTE 0.27 (*)     All other components within normal limits   LACTIC ACID LEVEL W/ REFLEX FOR LEVEL >2.0 - Normal   URINALYSIS, MACROSCOPIC - Normal   URINALYSIS, MICROSCOPIC -  Normal   ADULT ROUTINE BLOOD CULTURE, SET OF 2 BOTTLES (BACTERIA AND YEAST)   ADULT ROUTINE BLOOD CULTURE, SET OF 2 BOTTLES (BACTERIA AND YEAST)   CBC/DIFF    Narrative:     The following orders were created for panel order CBC/DIFF.  Procedure                               Abnormality         Status                     ---------                               -----------         ------                     CBC WITH DIFF[[id number]]                Abnormal            Final result               MANUAL DIFFERENTIAL[[id number]]          Abnormal            Edited Result - FINAL        Please view results for these tests on the individual orders.   URINALYSIS, MACROSCOPIC AND MICROSCOPIC W/CULTURE REFLEX    Narrative:     The following orders were created for panel order URINALYSIS, MACROSCOPIC AND MICROSCOPIC W/CULTURE REFLEX.  Procedure                               Abnormality         Status                     ---------                               -----------         ------                     URINALYSIS,  MACROSCOPIC[[id number]]      Normal              Final result               URINALYSIS, MICROSCOPIC[[id number]]      Normal              Final result                 Please view results for these tests on the individual orders.   COVID-19, FLU A/B, RSV RAPID BY PCR   PERFORM POC WHOLE BLOOD GLUCOSE     CT STROKE PROTOCOL (CTA HEAD/NECK WO/W)   Final Result   1. RIGHT CAROTID: LESS THAN 50% DIAMETER STENOSIS FOR THE RIGHT ICA   2. LEFT CAROTID: LESS THAN 50% DIAMETER STENOSIS FOR THE LEFT ICA   3. VERTEBRALS: NO HIGH-GRADE STENOSIS OR OCCLUSION   4. INTRACRANIAL: NO HIGH-GRADE STENOSIS OR OCCLUSION   5. CHRONIC APPEARING INTRACRANIAL CHANGES INVOLVING THE BRAIN WITH NO DEFINITE ACUTE FINDINGS. THERE IS CONTINUED CLINICAL CONCERN FOR OCCULT ISCHEMIA, FOLLOW-UP  WITH MRI CAN BE CONSIDERED         One or more dose reduction techniques were used (e.g., Automated exposure control, adjustment of the mA and/or kV according to patient size, use of iterative reconstruction technique).         Radiologist location ID: [id number]         XR AP MOBILE CHEST   Final Result   Left perihilar atelectasis versus early pneumonia         Radiologist location ID: [id number]             PENDING STUDIES AT TIME OF TRANSITION  CT head    ED COURSE/MEDICAL DECISION MAKING  Medications Administered in the ED   doxycycline hyclate 100 mg in NS 100 mL IVPB minibag (has no administration in time range)   naloxone (NARCAN) 1 mg/mL injection (2 mg Intravenous Given 03/15/22 1715)   iohexol (OMNIPAQUE 350) infusion (100 mL Intravenous Given 03/15/22 1804)   cefTRIAXone (ROCEPHIN) 1 g in NS 50 mL IVPB minibag (has no administration in time range)          Medical Decision Making  This patient's care was assumed from Dr. Vicenta Aly at shift change.  She presents to the ED with altered mental status.  The patient initially was very sedated and was nearly intubated.  She was given 2 mg of Narcan upon arrival that did seem to help a small amount.   Multiple CTs were obtained to rule out CVA.  Labs were obtained that also unremarkable.  It does appear the patient has a early left-sided pulmonary infiltrate.  The case was discussed with the hospitalist and she will be admitted.        CLINICAL IMPRESSION  Clinical Impression   Altered mental status, unspecified altered mental status type (Primary)     DISPOSITION  Admitted       DISCHARGE MEDICATIONS  Current Discharge Medication List          Jules Husbands D.O.   03/15/2022, 20:17   [facility]  Department of Emergency [place]    This note was partially generated using MModal Fluency Direct system, and there may be some incorrect words, spellings, and punctuation that were not noted in checking the note before saving.

## 2022-03-15 NOTE — ED Triage Notes (Addendum)
Bilateral lower ext numbness starting yesterday intermittently, husband states pt was "nodding off"  intermittently as well. Initial O2 saturation 85% upon EMS arrival. EMS also noted slurred speech. Pt on hydrocodone, Neurontin, oxycodone.    BWVRS: 20g left ac, normal saline kvo, 2L NC, BS 228, FAST ED 0

## 2022-03-15 NOTE — ED Nurses Note (Signed)
Blood cultures and labs drawn from peripheral IV at this time.

## 2022-03-15 NOTE — ED Provider Notes (Signed)
[facility]  ED Primary Provider Note  Patient Name: Laura Oconnell  Patient Age: 71 y.o.  Date of Birth: [birth year]    Chief Complaint: Numbness    A full HPI/ROS and history assessment was difficult to be reliably obtained as the patient is suffering from altered mental status.      History of Present Illness       Laura Oconnell is a 71 y.o. female who had concerns including Numbness.  This patient is a 71 year old female who presents with altered mental status reported lower extremity sensory change.  The patient is on multiple sedating substances per EMS report.  She was last seen well earlier today, however the patient's symptom onset was yesterday.        Review of Systems     No other overt Review of Systems are noted to be positive except noted in the HPI.      Historical Data   History Reviewed This Encounter:        Physical Exam   ED Triage Vitals [03/15/22 1703]   BP (Non-Invasive) (!) 103/58   Heart Rate 90   Respiratory Rate 16   Temperature 36.3 C (97.4 F)   SpO2 96 %   Weight 48.1 kg (106 lb)   Height 1.549 m ('5\' 1"'$ )         Nursing notes reviewed for what could be assessed. Past Medical, Surgical, and Social history reviewed for what has been completed.    Constitutional:  Frequent somnolence, the patient will arouse to painful stimuli.  Elderly.  Head: Normocephalic, atraumatic.  Mouth/Throat:  Slurred speech, symmetric facial movement.  Eyes: EOM grossly intact, conjunctiva normal.  Neck: Supple  Cardiovascular:  Non tachycardic Rate and Rhythm, extremities well perfused.  Pulmonary/Chest: No respiratory distress.  Abdominal: Soft, non-tender, non-distended. Non peritoneal, no rebound, no guarding.  MSK: No Lower Extremity Edema.  Skin: Warm, dry, and intact  Neuro:  Intermittently alert, CN II-XII grossly intact.  No pronator drift.  Intermittent tremor noted.  Psych: Pleasant              Procedures      Patient Data     Labs Ordered/Reviewed   ARTERIAL BLOOD  GAS/LACTATE - Abnormal; Notable for the following components:       Result Value    PCO2 (ARTERIAL) 48 (*)     PO2 (ARTERIAL) 53 (*)     OXYHEMOGLOBIN 84.6 (*)     All other components within normal limits   URINE DRUG SCREEN - Abnormal; Notable for the following components:    OPIATE Positive (*)     OXYCODONE URINE Positive (*)     All other components within normal limits    Narrative:     Any results reported as "positive" on this urine drug screen are unconfirmed screening results and should be used for medical(i.e.,treatment)purposes only. Unconfirmed screening results must not be used for non-medical purposes (e.g. employment or legal testing). Upon request, all results reported as "positive" can be sent to a reference laboratory for confirmation by [facility].     Reporting Limits (cut-off concentrations)     Cocaine 300 ng/mL  Opiates 300 ng/mL  THC 50 ng/mL  Amphetamine 1000 ng/mL  Phencyclidine 25 ng/mL  Benzodiazepine 300 ng/mL  Barbiturates 300 ng/mL  Methadone 300 ng/mL  Oxycodone 100 ng/mL  Buprenorphine 5 ng/mL  Fentanyl 5 ng/mL     URINALYSIS, MACROSCOPIC - Normal   URINALYSIS, MICROSCOPIC -  Normal   ADULT ROUTINE BLOOD CULTURE, SET OF 2 BOTTLES (BACTERIA AND YEAST)   ADULT ROUTINE BLOOD CULTURE, SET OF 2 BOTTLES (BACTERIA AND YEAST)   URINALYSIS, MACROSCOPIC AND MICROSCOPIC W/CULTURE REFLEX    Narrative:     The following orders were created for panel order URINALYSIS, MACROSCOPIC AND MICROSCOPIC W/CULTURE REFLEX.  Procedure                               Abnormality         Status                     ---------                               -----------         ------                     URINALYSIS, MACROSCOPIC[[id number]]      Normal              Final result               URINALYSIS, MICROSCOPIC[[id number]]      Normal              Final result                 Please view results for these tests on the individual orders.   CBC/DIFF    Narrative:     The following orders were created for panel order  CBC/DIFF.  Procedure                               Abnormality         Status                     ---------                               -----------         ------                     CBC WITH DIFF[[id number]]                                                                 Please view results for these tests on the individual orders.   COMPREHENSIVE METABOLIC PANEL, NON-FASTING   ETHANOL, SERUM/PLASMA   LACTIC ACID LEVEL W/ REFLEX FOR LEVEL >2.0   CBC WITH DIFF   PERFORM POC WHOLE BLOOD GLUCOSE       CT STROKE PROTOCOL (CTA HEAD/NECK WO/W)   Final Result by Edi, Radresults In (12/12 1847)   1. RIGHT CAROTID: LESS THAN 50% DIAMETER STENOSIS FOR THE RIGHT ICA   2. LEFT CAROTID: LESS THAN 50% DIAMETER STENOSIS FOR THE LEFT ICA   3. VERTEBRALS: NO HIGH-GRADE STENOSIS OR OCCLUSION   4. INTRACRANIAL: NO HIGH-GRADE STENOSIS OR OCCLUSION   5. CHRONIC APPEARING INTRACRANIAL CHANGES INVOLVING THE BRAIN WITH NO DEFINITE  ACUTE FINDINGS. THERE IS CONTINUED CLINICAL CONCERN FOR OCCULT ISCHEMIA, FOLLOW-UP WITH MRI CAN BE CONSIDERED         One or more dose reduction techniques were used (e.g., Automated exposure control, adjustment of the mA and/or kV according to patient size, use of iterative reconstruction technique).         Radiologist location ID: [id number]         XR AP MOBILE CHEST   Final Result by Edi, Radresults In (12/12 1758)   Left perihilar atelectasis versus early pneumonia         Radiologist location ID: [place] Decision Making          Medical Decision Making        Studies Assessed and/or Ordered:  Lab, EKG, radiology    EKG:   This EKG interpreted by me shows:    Rate:  115 beats per minute    Interpretation:  PR 154, No consistent ST Elevation, No Acute STEMI Identified.      MDM Narrative:  This patient is a 71 year old female who presents with altered mental status.  The patient does have reported multiple substances for which she takes including sedating medications.  On  arrival, the patient was frequently somnolent, for which a total of 2 mg Narcan was administered in divided doses.  This did provider response.  Patient underwent stroke protocol CT with no acute abnormality.  She did have findings consistent with pneumonia, and was able to converse although still encephalopathic.  While awaiting finalization of workup, the patient's care will be signed out to Dr. Sabra Heck             Medications Administered in the ED   cefTRIAXone (ROCEPHIN) 1 g in NS 50 mL IVPB minibag (has no administration in time range)   doxycycline hyclate 100 mg in NS 100 mL IVPB minibag (has no administration in time range)   naloxone (NARCAN) 1 mg/mL injection (2 mg Intravenous Given 03/15/22 1715)   iohexol (OMNIPAQUE 350) infusion (100 mL Intravenous Given 03/15/22 1804)       Disposition pending at the time of sign out. Care of Laura Oconnell will be signed out to Dr. Sabra Heck at [phone] following a discussion of the patient's course. Please refer to their Course Note for further details of the patient's ED course.             Clinical Impression   Altered mental status, unspecified altered mental status type (Primary)         Current Discharge Medication List            /R. Baldo Daub, MD, [facility]  Department of Emergency Medicine  [facility]

## 2022-03-15 NOTE — ED Nurses Note (Signed)
Patient moving all limbs, flopping on the bed post narcan.

## 2022-03-15 NOTE — H&P (Signed)
[place]    HOSPITALIST H&P    Laura Oconnell 71 y.o. female ED08/ED08   Date of Service: 03/15/2022    Date of Admission:  03/15/2022   PCP: Octaviano Batty, MD Code Status:No Order       Chief Complaint: ams       HPI:   This 72 year old white female with known history of COPD, who does not use any oxygen at home, presents to the ED with "shaking" for the last 2-3 days.  She does not report any shortness a breath, but has had some nonproductive cough.  Apparently she was altered upon arrival to the ED via EMS, and was given Narcan for questionable unintentional overdose.  Patient is on multiple pain medications for chronic pain disorder.  She did respond to Narcan, however became somewhat confused.  She was protecting her airway, did not require intubation.  Her mentation did improve, and she was able to give [place] for history following.  She does not state that she has taken too much medication, were had tried to hurt herself.  She was hypoxic, requiring 2 L oxygen, she did test positive for COVID-19, and chest x-ray did show basilar infiltrates.  Her white blood count was low at 1.4, hemoglobin 10.3, hematocrit 30.4, platelets of 94.  She has D-dimer pending.  Her sodium was 146, potassium 3.4, chloride of 111, bicarb 27, BUN 29, creatinine 0.94.  Lactic was normal at 0.5.  Ethanol was negative, drug screen was positive for opiates, oxycodone which he was prescribed.  Patient was wanting to go home, but as she came in altered, requiring Narcan, with confirmed COVID-19, requiring oxygen at this time, it is felt she would benefit from admission the floor for continued management of respiratory symptoms with remdesivir, antibiotics, steroids, and albuterol.  Patient agreeable to the above plan.  D-dimer is pending, if positive will ask for CT angio of the chest as well    ED medications:   Medications Administered in the ED   naloxone (NARCAN) 1 mg/mL injection (2 mg Intravenous  Given 03/15/22 1715)   iohexol (OMNIPAQUE 350) infusion (100 mL Intravenous Given 03/15/22 1804)   cefTRIAXone (ROCEPHIN) 1 g in NS 50 mL IVPB minibag (has no administration in time range)   doxycycline hyclate 100 mg in NS 100 mL IVPB minibag (has no administration in time range)         PMHx:    Past Medical History:   Diagnosis Date    Bronchiectasis with (acute) exacerbation (CMS HCC) 08/03/2021    Chronic obstructive pulmonary disease with acute exacerbation (CMS HCC) 09/18/2015    Chronic pain disorder 05/29/2015    Chronic, continuous use of opioids 08/03/2021    Closed right hip fracture, initial encounter (CMS [place]) 02/01/2021    Compression fracture 05/29/2015    Formatting of this note might be different from the original. Per Dr. 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    PSHx:   Past Surgical History:   Procedure Laterality Date    CESAREAN SECTION      HX HYSTERECTOMY      PARTIAL GASTRECTOMY         Allergies:    No Known Allergies Social History  Social History     Tobacco Use    Smoking status: Former     Packs/day: 2.00     Years: 42.00     Additional pack years: 0.00     Total pack years: 84.00     Types: Cigarettes     Quit date: 2008     Years since quitting: 15.9    Smokeless tobacco:  Never   Vaping Use    Vaping Use: Never used   Substance Use Topics    Drug use: Never       Family History  Family Medical History:       Problem Relation (Age of Onset)    COPD Mother, Father               Home Meds:      Prior to Admission medications    Medication Sig Start Date End Date Taking? Authorizing Provider   baclofen (LIORESAL) 10 mg Oral Tablet Take 1 Tablet (10 mg total) by mouth Three times a day as needed 07/21/21  Yes Provider, Historical   Calcium-Cholecalciferol, D3, 600 mg-5 mcg (200 unit) Oral Capsule Take 1 Capsule by mouth Once a day   Yes Provider, Historical   COPPER ORAL Take 2 mg by mouth Once a day   Yes Provider, Historical   Diltiazem HCl (TIAZAC) 120 mg Oral Capsule,Sustained Action 24 hr Take 1 Capsule (120 mg total) by mouth Once a day   Yes Provider, Historical   ferrous sulfate (FERATAB) 324 mg (65 mg iron) Oral Tablet, Delayed Release (E.C.) Take 1 Tablet (324 mg total) by mouth Once a day   Yes Provider, Historical   furosemide (LASIX) 20 mg Oral Tablet Take 1 Tablet (20 mg total) by mouth Once a day 09/02/21  Yes Dan Humphreys, FNP-BC   gabapentin (NEURONTIN) 800 mg Oral Tablet Take 1 Tablet (800 mg total) by mouth Three times a day 05/31/21  Yes Provider, Historical   HYDROcodone-acetaminophen (NORCO) 5-325 mg Oral Tablet Take 1 Tablet by mouth Every 6 hours as needed 07/23/21  Yes Provider, Historical   memantine (NAMENDA) 10 mg Oral Tablet 1 Tablet (10 mg total) Twice daily 10/20/20  Yes Provider, Historical   omeprazole (PRILOSEC) 20 mg Oral Capsule, Delayed Release(E.C.) Take 1 Capsule (20 mg total) by mouth Once a day 04/01/21  Yes Provider, Historical   oxyCODONE (OXYCONTIN) 10 mg Oral tablet,oral only,ext.rel.12 hr Take 1 Tablet (10 mg total) by mouth Every 12 hours 07/23/21  Yes Provider, Historical   PARoxetine (PAXIL) 40 mg Oral Tablet Take 1 Tablet (40 mg total) by mouth Every morning 04/01/21 04/01/22 Yes Provider, Historical   rOPINIRole (REQUIP) 0.5 mg Oral Tablet  Take 1 Tablet (0.5 mg total) by mouth Every night as needed   Yes Provider, Historical   tiotropium bromide (SPIRIVA HANDIHALER) 18 mcg Inhalation Capsule, w/Inhalation Device Take 1 Capsule (18 mcg total) by inhalation Once a day   Yes Provider, Historical   baclofen (LIORESAL) 5  mg Oral Tablet Take 1 Tablet (5 mg total) by mouth Three times a day as needed 07/21/21 03/15/22  Provider, Historical          ROS:   General: No fever or chills. No weight changes, positive for fatigue, weakness and shaking for the last 2 days.   HEENT: No headaches, dizziness, changes in vision, changes in hearing, or difficulty swallowing.    Skin:  No rashes, erythema or bruises.   Cardiac: No chest pain, palpitations, or arrhythmia.    Respiratory:  Cough, weakness, shaking at home  GI: No nausea or vomiting. No abdominal pain.   Urinary: No dysuria, hematuria, or change in frequency.    Vascular: No edema.     Musculoskeletal: No muscle weakness, pain, or decreased range of motion.   Neurologic: No loss of sensation, numbness or tingling.   Endocrine: No heat or cold intolerance or polydipsia.   Psychiatric: No insomnia, depression or anxiety.      Results for orders placed or performed during the hospital encounter of 03/15/22 (from the past 24 hour(s))   ECG 12 LEAD   Result Value Ref Range    Ventricular rate 115 BPM    Atrial Rate 115 BPM    PR Interval 154 ms    QRS Duration 112 ms    QT Interval 342 ms    QTC Calculation 473 ms    Calculated P Axis 71 degrees    Calculated R Axis 101 degrees    Calculated T Axis 58 degrees   ARTERIAL BLOOD GAS/LACTATE   Result Value Ref Range    PH (ARTERIAL) 7.36 7.35 - 7.45    PCO2 (ARTERIAL) 48 (H) 35 - 45 mm/Hg    BICARBONATE (ARTERIAL) 25.3 20.0 - 26.0 mmol/L    BASE EXCESS (ARTERIAL) 1.9 0.0 - 2.0 mmol/L    MET-HEMOGLOBIN 0.4 <=2.0 %    LACTATE 0.6 <=2.0 mmol/L    CARBOXYHEMOGLOBIN 1.4 <=1.5 %    O2CT 14.1 %    %FIO2 (ARTERIAL) 21 %    PO2 (ARTERIAL) 53 (L) 80 - 100 mm/Hg    OXYHEMOGLOBIN  84.6 (LL) 88.0 - 100.0 %    ALLEN TEST yes     DRAW SITE left radial    URINE DRUG SCREEN   Result Value Ref Range    AMPHET QL Negative Negative    BARB QL Negative Negative    BENZO QL Negative Negative    BUP QL Negative Negative    CANNAQL Negative Negative    COCQL Negative Negative    FENTANYL, RANDOM URINE Negative Negative    OPIATE Positive (A) Negative    OXYCODONE URINE Positive (A) Negative    PCP QL Negative Negative    METHQL Negative Negative   URINALYSIS, MACROSCOPIC   Result Value Ref Range    COLOR Light Yellow Colorless, Light Yellow, Yellow    APPEARANCE Clear Clear    SPECIFIC GRAVITY 1.011 1.002 - 1.030    PH 6.0 5.0 - 9.0    LEUKOCYTES Negative Negative, 100  WBCs/uL    NITRITE Negative Negative    PROTEIN Negative Negative, 10 , 20  mg/dL    GLUCOSE Negative Negative, 30  mg/dL    KETONES Negative Negative, Trace mg/dL    BILIRUBIN Negative Negative, 0.5 mg/dL    BLOOD Negative Negative, 0.03 mg/dL    UROBILINOGEN Normal Normal mg/dL   URINALYSIS, MICROSCOPIC   Result Value Ref Range    RBCS <1 <4 /hpf  WBCS <1 <6 /hpf   COMPREHENSIVE METABOLIC PANEL, NON-FASTING   Result Value Ref Range    SODIUM 146 (H) 136 - 145 mmol/L    POTASSIUM 3.4 (L) 3.5 - 5.1 mmol/L    CHLORIDE 111 (H) 98 - 107 mmol/L    CO2 TOTAL 27 21 - 31 mmol/L    ANION GAP 8 4 - 13 mmol/L    BUN 29 (H) 7 - 25 mg/dL    CREATININE 0.94 0.60 - 1.30 mg/dL    BUN/CREA RATIO 31 (H) 6 - 22    ESTIMATED GFR 64 >59 mL/min/1.66m2    ALBUMIN 3.5 3.5 - 5.7 g/dL    CALCIUM 8.2 (L) 8.6 - 10.3 mg/dL    GLUCOSE 109 74 - 109 mg/dL    ALKALINE PHOSPHATASE 68 34 - 104 U/L    ALT (SGPT) 11 7 - 52 U/L    AST (SGOT) 16 13 - 39 U/L    BILIRUBIN TOTAL 0.2 (L) 0.3 - 1.2 mg/dL    PROTEIN TOTAL 6.4 6.4 - 8.9 g/dL    ALBUMIN/GLOBULIN RATIO 1.2 0.8 - 1.4    OSMOLALITY, CALCULATED 297 (H) 270 - 290 mOsm/kg    CALCIUM, CORRECTED 8.6 (L) 8.9 - 10.8 mg/dL    GLOBULIN 2.9 2.9 - 5.4   ETHANOL, SERUM/PLASMA   Result Value Ref Range    ETHANOL <10 (H) 0 mg/dL    LACTIC ACID LEVEL W/ REFLEX FOR LEVEL >2.0   Result Value Ref Range    LACTIC ACID 0.5 0.5 - 2.2 mmol/L   CBC WITH DIFF   Result Value Ref Range    WBC 1.4 (LL) 3.8 - 11.8 x10^3/uL    RBC 3.53 (L) 3.63 - 4.92 x10^6/uL    HGB 10.3 (L) 10.9 - 14.3 g/dL    HCT 30.4 (L) 31.2 - 41.9 %    MCV 86.2 75.5 - 95.3 fL    MCH 29.3 24.7 - 32.8 pg    MCHC 33.9 32.3 - 35.6 g/dL    RDW 15.2 12.3 - 17.7 %    PLATELETS 94 (L) 140 - 440 x10^3/uL    MPV 7.1 (L) 7.9 - 10.8 fL   MANUAL DIFFERENTIAL   Result Value Ref Range    WBC 1.4 x10^3/uL    NEUTROPHIL % 71 40 - 76 %    LYMPHOCYTE % 19 (L) 25 - 45 %    MONOCYTE % 9 0 - 12 %    EOSINOPHIL % 1 0 - 7 %    BASOPHIL %      METAMYELOCYTE %      MYELOCYTE %      PROMYELOCYTE %      BAND %      BLAST %      OTHER %      NEUTROPHIL ABSOLUTE 0.99 (L) 1.80 - 8.40 x10^3/uL    LYMPHOCYTE ABSOLUTE 0.27 (L) 1.10 - 5.00 x10^3/uL    MONOCYTE ABSOLUTE 0.13 0.00 - 1.30 x10^3/uL    EOSINOPHIL ABSOLUTE 0.01 0.00 - 0.80 x10^3/uL    BASOPHIL ABSOLUTE      METAMYELOCYTE ABSOLUTE      MYELOCYTE ABSOLUTE      PROMYELOCYTE ABSOLUTE      BLAST ABSOLUTE      OTHER CELL ABSOLUTE      ANISOCYTOSIS      POLYCHROMASIA      POIKILOCYTOSIS      BASOPHILIC STIPPLING      MICROCYTOSIS      MACROCYTOSIS  ROULEAUX      SCHISTOCYTES      SPHEROCYTES      TARGET CELLS      TEARDROP CELLS      OVALOCYTE (ELLIPTOCYTE)      CRENATED RED CELLS      STOMATOCYTES      ACANTHOCYTES (SPUR CELL)      ECHINOCYTE (BURR CELL)      BLISTER CELLS      RBC AGGLUTINATES      HOWELL JOLLY BODIES      ATYPICAL LYMPHOCYTES      TOXIC GRANULATION      DOHLE BODIES      TOXIC VACUOLIZATION      AUER RODS      BASKET CELLS      HYPERSEGMENTATION      LARGE PLATELETS      PLATELET CLUMPS      WBC MORPHOLOGY COMMENT      RBC MORPHOLOGY COMMENT Normal     PLATELET MORPHOLOGY COMMENT Decreased     BANDS NEUTROPHILS MANUAL      BAND ABSOLUTE      NEUTROPHILS MANUAL 71     LYMPHOCYTES MANUAL 19     MONOCYTES MANUAL 9     EOSINOPHILS MANUAL 1      BASOPHILS MANUAL      PROMYELOCYTES MANUAL      MYELOCYTES MANUAL      METAMYELOCYTES MANUAL      BLASTS MANUAL      TOTAL CELLS COUNTED [#] IN BLOOD 100     OTHER CELLS MANUAL      NUCLEATED RBC MANUAL      PLASMA CELL %      PLASMA CELL ABSOLUE      PLASMA CELLS MANUAL      HYPOCHROMASIA     COVID-19, FLU A/B, RSV RAPID BY PCR   Result Value Ref Range    SARS-CoV-2 Detected (A) Not Detected    INFLUENZA VIRUS TYPE A Not Detected Not Detected    INFLUENZA VIRUS TYPE B Not Detected Not Detected    RESPIRATORY SYNCTIAL VIRUS (RSV) Not Detected Not Detected          Physical:  Filed Vitals:    03/15/22 2000 03/15/22 2045 03/15/22 2100 03/15/22 2115   BP: 135/68  (!) 102/91    Pulse: 95 99 95 87   Resp:   16 16   Temp:       SpO2: 95% 95% 95% 96%      General: Patient is alert and oriented to person, place, and time. No acute distress. Communicates appropriately.   Head: Normocephalic and atraumatic.    Eyes: Pupils equally round and react to light and accommodate. Extraocular movements intact.  Conjunctiva normal. Sclerae are normal.    Nose: Nasal passages clear. Mucosa moist.    Throat: Moist oral mucosa. No erythema or exudate of the pharynx. Clear oropharynx.    Neck: Supple. No cervical lymphadenopathy or supraclavicular nodes detected. Trachea midline   Heart: Regular rate and rhythm. S1 & S2 present. No S3 or S4. No rubs, gallops, or murmurs appreciated.   Lungs:  Diminished breath sounds bilaterally with rales noted at the bases bilaterally.  No accessory muscle use or respiratory distress.  Abdomen: Soft, nontender, nondistended belly. Bowel sounds are present in all four quadrants. No rigidity.  No guarding.  No ascites.   Extremities: No edema, cyanosis, or clubbing. Grossly moves all extremities.    Skin: Warm and dry without  lesions. No ecchymosis noted.    Neurologic: Cranial nerves II through XII are grossly intact.Strength 5/5 in upper extremities and lower extremities bilaterally.    Genitourinary:   No urinary incontinence or Foley catheter   Psychiatric: Judgment and insight are intact. Mood and affect are appropriate for the situation.       Diagnostic studies:  No results found.       EKG interpretation:     _0 @    Assessment:  Active Hospital Problems   (*Primary Problem)    Diagnosis    *Acute respiratory failure due to COVID-19 (CMS HCC)    Toxic encephalopathy    COPD (chronic obstructive pulmonary disease) (CMS HCC)    Chronic pain disorder       Plan:  Patient will be admitted for the above problems.  Patient will be admitted to telemetry floor with remdesivir, Decadron, Rocephin, doxycycline, and albuterol.  She will be maintained on oxygen 2 L keep saturation above 88%.  She will have case management consultation in a.m., she states she requests to be DNR, also to assess for any need for assistance at home.  She will have physical therapy evaluation tomorrow as well.  Labs.  Repeated daily, and we will add D-dimer now, if elevated, we will consider CT angiogram.  Her earlier platelets are low, we will defer on heparin for now, if D-dimer negative, will use SCDs for DVT prophylaxis only.  Further interventions will be based on her clinical course.  The hospitalist has examined patient, and reviewed all material, and agrees with the above medical management at this time.      DVT prophylaxis: scds        Annamarie Dawley, PA-C    [place] [facility]OSPITAL[facility]

## 2022-03-16 ENCOUNTER — Inpatient Hospital Stay (HOSPITAL_COMMUNITY): Payer: Medicare Other

## 2022-03-16 DIAGNOSIS — J9600 Acute respiratory failure, unspecified whether with hypoxia or hypercapnia: Secondary | ICD-10-CM

## 2022-03-16 DIAGNOSIS — I4510 Unspecified right bundle-branch block: Secondary | ICD-10-CM

## 2022-03-16 DIAGNOSIS — Z9981 Dependence on supplemental oxygen: Secondary | ICD-10-CM

## 2022-03-16 DIAGNOSIS — G8929 Other chronic pain: Secondary | ICD-10-CM

## 2022-03-16 DIAGNOSIS — R000 Tachycardia, unspecified: Secondary | ICD-10-CM

## 2022-03-16 LAB — MANUAL DIFFERENTIAL
EOSINOPHIL %: 1 % (ref 0–7)
EOSINOPHIL ABSOLUTE: 0.01 10*3/uL (ref 0.00–0.80)
EOSINOPHILS MANUAL: 1
LYMPHOCYTE %: 32 % (ref 25–45)
LYMPHOCYTE ABSOLUTE: 0.26 10*3/uL — ABNORMAL LOW (ref 1.10–5.00)
LYMPHOCYTES MANUAL: 32
MONOCYTE %: 12 % (ref 0–12)
MONOCYTE ABSOLUTE: 0.1 10*3/uL (ref 0.00–1.30)
MONOCYTES MANUAL: 12
NEUTROPHIL %: 55 % (ref 40–76)
NEUTROPHIL ABSOLUTE: 0.44 10*3/uL — CL (ref 1.80–8.40)
NEUTROPHILS MANUAL: 55
PLATELET MORPHOLOGY COMMENT: ADEQUATE
TOTAL CELLS COUNTED [#] IN BLOOD: 100
WBC: 0.8 10*3/uL

## 2022-03-16 LAB — CBC WITH DIFF
HCT: 35.7 % (ref 31.2–41.9)
HGB: 12 g/dL (ref 10.9–14.3)
MCH: 29 pg (ref 24.7–32.8)
MCHC: 33.5 g/dL (ref 32.3–35.6)
MCV: 86.4 fL (ref 75.5–95.3)
MPV: 7.2 fL — ABNORMAL LOW (ref 7.9–10.8)
PLATELETS: 103 10*3/uL — ABNORMAL LOW (ref 140–440)
RBC: 4.13 10*6/uL (ref 3.63–4.92)
RDW: 15.2 % (ref 12.3–17.7)
WBC: 0.8 10*3/uL — CL (ref 3.8–11.8)

## 2022-03-16 LAB — MAGNESIUM: MAGNESIUM: 1.7 mg/dL — ABNORMAL LOW (ref 1.9–2.7)

## 2022-03-16 LAB — ECG 12 LEAD
Atrial Rate: 115 {beats}/min
Calculated P Axis: 71 degrees
Calculated R Axis: 101 degrees
Calculated T Axis: 58 degrees
PR Interval: 154 ms
QRS Duration: 112 ms
QT Interval: 342 ms
QTC Calculation: 473 ms
Ventricular rate: 115 {beats}/min

## 2022-03-16 LAB — BASIC METABOLIC PANEL
ANION GAP: 8 mmol/L (ref 4–13)
BUN/CREA RATIO: 29 — ABNORMAL HIGH (ref 6–22)
BUN: 22 mg/dL (ref 7–25)
CALCIUM: 8.5 mg/dL — ABNORMAL LOW (ref 8.6–10.3)
CHLORIDE: 112 mmol/L — ABNORMAL HIGH (ref 98–107)
CO2 TOTAL: 27 mmol/L (ref 21–31)
CREATININE: 0.77 mg/dL (ref 0.60–1.30)
ESTIMATED GFR: 82 mL/min/{1.73_m2} (ref >59)
GLUCOSE: 169 mg/dL — ABNORMAL HIGH (ref 74–109)
OSMOLALITY, CALCULATED: 300 mOsm/kg — ABNORMAL HIGH (ref 270–290)
POTASSIUM: 4 mmol/L (ref 3.5–5.1)
SODIUM: 147 mmol/L — ABNORMAL HIGH (ref 136–145)

## 2022-03-16 LAB — POC BLOOD GLUCOSE (RESULTS)
GLUCOSE, POC: 94 mg/dl (ref 50–500)
GLUCOSE, POC: 125 mg/dl (ref 50–500)
GLUCOSE, POC: 110 mg/dl (ref 50–500)

## 2022-03-16 MED ORDER — DOXYCYCLINE HYCLATE 100 MG TABLET
ORAL_TABLET | ORAL | Status: AC
Start: 2022-03-16 — End: 2022-03-16
  Filled 2022-03-16: qty 1

## 2022-03-16 MED ORDER — ENOXAPARIN 40 MG/0.4 ML SUBCUTANEOUS SYRINGE
40.0000 mg | INJECTION | SUBCUTANEOUS | Status: DC
Start: 2022-03-16 — End: 2022-03-19
  Administered 2022-03-16 – 2022-03-19 (×4): 40 mg via SUBCUTANEOUS
  Filled 2022-03-16 (×2): qty 0.4

## 2022-03-16 MED ORDER — DEXTROSE 50 % IN WATER (D50W) INTRAVENOUS SYRINGE
12.5000 g | INJECTION | INTRAVENOUS | Status: DC | PRN
Start: 2022-03-16 — End: 2022-03-19

## 2022-03-16 MED ORDER — CHOLECALCIFEROL (VITAMIN D3) 25 MCG (1,000 UNIT) TABLET
1000.0000 [IU] | ORAL_TABLET | Freq: Every day | ORAL | Status: DC
Start: 2022-03-16 — End: 2022-03-19
  Administered 2022-03-16 – 2022-03-19 (×4): 1000 [IU] via ORAL
  Filled 2022-03-16 (×2): qty 1

## 2022-03-16 MED ORDER — ASCORBIC ACID (VITAMIN C) 500 MG TABLET
500.0000 mg | ORAL_TABLET | Freq: Every day | ORAL | Status: DC
Start: 2022-03-16 — End: 2022-03-19
  Administered 2022-03-16 – 2022-03-19 (×4): 500 mg via ORAL
  Filled 2022-03-16 (×2): qty 1

## 2022-03-16 MED ORDER — ZINC SULFATE 50 MG ZINC (220 MG) CAPSULE
ORAL_CAPSULE | ORAL | Status: AC
Start: 2022-03-16 — End: 2022-03-16
  Filled 2022-03-16: qty 1

## 2022-03-16 MED ORDER — IOHEXOL 350 MG IODINE/ML INTRAVENOUS SOLUTION
75.0000 mL | INTRAVENOUS | Status: AC
Start: 2022-03-16 — End: 2022-03-16
  Administered 2022-03-16: 75 mL via INTRAVENOUS

## 2022-03-16 MED ORDER — HYDROCODONE 5 MG-ACETAMINOPHEN 325 MG TABLET
1.0000 | ORAL_TABLET | Freq: Four times a day (QID) | ORAL | Status: DC | PRN
Start: 2022-03-16 — End: 2022-03-19
  Administered 2022-03-17 – 2022-03-19 (×4): 1 via ORAL
  Filled 2022-03-16 (×4): qty 1

## 2022-03-16 MED ORDER — FUROSEMIDE 40 MG TABLET
20.0000 mg | ORAL_TABLET | Freq: Every day | ORAL | Status: DC
Start: 2022-03-16 — End: 2022-03-19
  Administered 2022-03-16 – 2022-03-19 (×4): 20 mg via ORAL
  Filled 2022-03-16 (×2): qty 1

## 2022-03-16 MED ORDER — PAROXETINE 20 MG TABLET
40.0000 mg | ORAL_TABLET | Freq: Every morning | ORAL | Status: DC
Start: 2022-03-16 — End: 2022-03-19
  Administered 2022-03-16 – 2022-03-19 (×4): 40 mg via ORAL
  Filled 2022-03-16 (×6): qty 2

## 2022-03-16 MED ORDER — FUROSEMIDE 40 MG TABLET
ORAL_TABLET | ORAL | Status: AC
Start: 2022-03-16 — End: 2022-03-16
  Filled 2022-03-16: qty 1

## 2022-03-16 MED ORDER — GLUCAGON 1 MG/ML SOLUTION FOR INJECTION
1.0000 mg | INTRAMUSCULAR | Status: DC | PRN
Start: 2022-03-16 — End: 2022-03-19

## 2022-03-16 MED ORDER — MAGNESIUM SULFATE 1 GRAM/100 ML IN DEXTROSE 5 % INTRAVENOUS PIGGYBACK
1.0000 g | INJECTION | Freq: Once | INTRAVENOUS | Status: AC
Start: 2022-03-16 — End: 2022-03-16
  Administered 2022-03-16: 0 g via INTRAVENOUS
  Administered 2022-03-16: 1 g via INTRAVENOUS

## 2022-03-16 MED ORDER — FERROUS SULFATE 324 MG (65 MG IRON) TABLET,DELAYED RELEASE
DELAYED_RELEASE_TABLET | ORAL | Status: AC
Start: 2022-03-16 — End: 2022-03-16
  Filled 2022-03-16: qty 1

## 2022-03-16 MED ORDER — ENOXAPARIN 40 MG/0.4 ML SUBCUTANEOUS SYRINGE
INJECTION | SUBCUTANEOUS | Status: AC
Start: 2022-03-16 — End: 2022-03-16
  Filled 2022-03-16: qty 0.4

## 2022-03-16 MED ORDER — ZINC SULFATE 50 MG ZINC (220 MG) CAPSULE
50.0000 mg | ORAL_CAPSULE | Freq: Every day | ORAL | Status: DC
Start: 2022-03-17 — End: 2022-03-19
  Administered 2022-03-17 – 2022-03-19 (×3): 50 mg via ORAL
  Filled 2022-03-16 (×2): qty 1

## 2022-03-16 MED ORDER — INSULIN REGULAR HUMAN 100 UNIT/ML INJECTION SSIP
0.0000 [IU] | INJECTION | Freq: Four times a day (QID) | SUBCUTANEOUS | Status: DC | PRN
Start: 2022-03-16 — End: 2022-03-19

## 2022-03-16 MED ORDER — DEXTROSE 5 % AND 0.45 % SODIUM CHLORIDE INTRAVENOUS SOLUTION
INTRAVENOUS | Status: DC
Start: 2022-03-16 — End: 2022-03-19
  Administered 2022-03-16: 0 mL via INTRAVENOUS

## 2022-03-16 MED ORDER — GABAPENTIN 600 MG TABLET
600.0000 mg | ORAL_TABLET | Freq: Three times a day (TID) | ORAL | Status: DC
Start: 2022-03-16 — End: 2022-03-19
  Administered 2022-03-16 – 2022-03-19 (×10): 600 mg via ORAL
  Filled 2022-03-16 (×7): qty 1

## 2022-03-16 MED ORDER — ONDANSETRON HCL (PF) 4 MG/2 ML INJECTION SOLUTION
INTRAMUSCULAR | Status: AC
Start: 2022-03-16 — End: 2022-03-16
  Filled 2022-03-16: qty 2

## 2022-03-16 MED ORDER — OXYCODONE ER 10 MG TABLET,CRUSH RESISTANT,EXTENDED RELEASE 12 HR
10.0000 mg | EXTENDED_RELEASE_ORAL_TABLET | Freq: Two times a day (BID) | ORAL | Status: DC
Start: 2022-03-16 — End: 2022-03-19
  Administered 2022-03-16 – 2022-03-19 (×6): 10 mg via ORAL
  Administered 2022-03-19: 0 mg via ORAL
  Filled 2022-03-16 (×5): qty 1

## 2022-03-16 MED ORDER — MAGNESIUM SULFATE 1 GRAM/100 ML IN DEXTROSE 5 % INTRAVENOUS PIGGYBACK
INJECTION | INTRAVENOUS | Status: AC
Start: 2022-03-16 — End: 2022-03-16
  Filled 2022-03-16: qty 200

## 2022-03-16 MED ORDER — ASCORBIC ACID (VITAMIN C) 500 MG TABLET
ORAL_TABLET | ORAL | Status: AC
Start: 2022-03-16 — End: 2022-03-16
  Filled 2022-03-16: qty 1

## 2022-03-16 MED ORDER — FERROUS SULFATE 324 MG (65 MG IRON) TABLET,DELAYED RELEASE
324.0000 mg | DELAYED_RELEASE_TABLET | Freq: Every day | ORAL | Status: DC
Start: 2022-03-16 — End: 2022-03-19
  Administered 2022-03-16 – 2022-03-19 (×4): 324 mg via ORAL
  Filled 2022-03-16 (×2): qty 1

## 2022-03-16 MED ORDER — ONDANSETRON HCL (PF) 4 MG/2 ML INJECTION SOLUTION
4.0000 mg | Freq: Three times a day (TID) | INTRAMUSCULAR | Status: DC | PRN
Start: 2022-03-16 — End: 2022-03-19
  Administered 2022-03-16 – 2022-03-17 (×3): 4 mg via INTRAVENOUS
  Filled 2022-03-16: qty 2

## 2022-03-16 MED ORDER — SODIUM CHLORIDE 0.9 % INTRAVENOUS PIGGYBACK
INJECTION | INTRAVENOUS | Status: AC
Start: 2022-03-16 — End: 2022-03-16
  Filled 2022-03-16: qty 50

## 2022-03-16 MED ORDER — ZINC SULFATE 50 MG ZINC (220 MG) TABLET
50.0000 mg | ORAL_TABLET | Freq: Every day | ORAL | Status: DC
Start: 2022-03-16 — End: 2022-03-16
  Administered 2022-03-16: 50 mg via ORAL
  Filled 2022-03-16 (×2): qty 1

## 2022-03-16 MED ORDER — CHOLECALCIFEROL (VITAMIN D3) 25 MCG (1,000 UNIT) TABLET
ORAL_TABLET | ORAL | Status: AC
Start: 2022-03-16 — End: 2022-03-16
  Filled 2022-03-16: qty 1

## 2022-03-16 MED ORDER — GABAPENTIN 600 MG TABLET
ORAL_TABLET | ORAL | Status: AC
Start: 2022-03-16 — End: 2022-03-16
  Filled 2022-03-16: qty 1

## 2022-03-16 MED ORDER — IPRATROPIUM 0.5 MG-ALBUTEROL 3 MG (2.5 MG BASE)/3 ML NEBULIZATION SOLN
3.0000 mL | INHALATION_SOLUTION | Freq: Four times a day (QID) | RESPIRATORY_TRACT | Status: DC
Start: 2022-03-16 — End: 2022-03-19
  Administered 2022-03-16: 0 mL via RESPIRATORY_TRACT
  Administered 2022-03-16 – 2022-03-18 (×7): 3 mL via RESPIRATORY_TRACT
  Administered 2022-03-18: 0 mL via RESPIRATORY_TRACT
  Administered 2022-03-18 – 2022-03-19 (×4): 3 mL via RESPIRATORY_TRACT
  Filled 2022-03-16 (×4): qty 3

## 2022-03-16 MED ORDER — CEFTRIAXONE 1 GRAM SOLUTION FOR INJECTION
INTRAMUSCULAR | Status: AC
Start: 2022-03-16 — End: 2022-03-16
  Filled 2022-03-16: qty 10

## 2022-03-16 MED ORDER — OXYCODONE ER 10 MG TABLET,CRUSH RESISTANT,EXTENDED RELEASE 12 HR
EXTENDED_RELEASE_ORAL_TABLET | ORAL | Status: AC
Start: 2022-03-16 — End: 2022-03-16
  Filled 2022-03-16: qty 1

## 2022-03-16 MED ORDER — MEMANTINE 10 MG TABLET
10.0000 mg | ORAL_TABLET | Freq: Two times a day (BID) | ORAL | Status: DC
Start: 2022-03-16 — End: 2022-03-19
  Administered 2022-03-16 – 2022-03-19 (×7): 10 mg via ORAL
  Filled 2022-03-16 (×11): qty 1

## 2022-03-16 MED ORDER — DEXTROSE 50 % IN WATER (D50W) INTRAVENOUS SYRINGE
25.0000 g | INJECTION | INTRAVENOUS | Status: DC | PRN
Start: 2022-03-16 — End: 2022-03-19

## 2022-03-16 MED ORDER — MAGNESIUM SULFATE 1 GRAM/100 ML IN DEXTROSE 5 % INTRAVENOUS PIGGYBACK
1.0000 g | INJECTION | Freq: Once | INTRAVENOUS | Status: AC
Start: 2022-03-16 — End: 2022-03-16
  Administered 2022-03-16: 1 g via INTRAVENOUS
  Administered 2022-03-16: 0 g via INTRAVENOUS

## 2022-03-16 MED ORDER — ROPINIROLE 0.5 MG TABLET
0.5000 mg | ORAL_TABLET | Freq: Every evening | ORAL | Status: DC
Start: 2022-03-16 — End: 2022-03-19
  Administered 2022-03-16 – 2022-03-18 (×3): 0.5 mg via ORAL
  Filled 2022-03-16 (×6): qty 1

## 2022-03-16 MED ORDER — DILTIAZEM ER 60 MG CAPSULE,EXTENDED RELEASE 12 HR
120.0000 mg | ORAL_CAPSULE | Freq: Every day | ORAL | Status: DC
Start: 2022-03-16 — End: 2022-03-18
  Administered 2022-03-16 – 2022-03-17 (×2): 120 mg via ORAL
  Filled 2022-03-16 (×4): qty 2

## 2022-03-16 NOTE — ED Nurses Note (Signed)
PT provided with breakfast tray and ginger ale. Pt also brought a denture cup to place her teeth. Phone charger found and plugged in for patient so she could speak with her family.

## 2022-03-16 NOTE — Progress Notes (Signed)
[facility]   IP PROGRESS NOTE      Laura Oconnell, Laura Oconnell  Date of Admission:  03/15/2022  Date of Birth:  May 14, 1950  Date of Service:  03/16/2022    Hospital Day:  LOS: 1 day     Chief Complaint: AMS    HPI:  Laura Oconnell is a 71 y.o. female who is being seen in f/u for acute respiratory failure due to COVID, toxic encephalopathy. Patient is alert and oriented this am. States she feels better overall. Still SOB, requiring 1L O2 Does not use O2 at home. Having some cough and wheezing.     Review of Systems:  Please refer to HPI     Vital Signs:  Temp (24hrs) Max:36.3 C (97.4 F)      Temperature: 36.3 C (97.4 F)  BP (Non-Invasive): (!) 154/77  MAP (Non-Invasive): 95 mmHG  Heart Rate: 98  Respiratory Rate: (!) 25  SpO2: 94 %    Physical Examination  General: Patient is alert and oriented to person, place, and time. No acute distress. Communicates appropriately.   Head: Normocephalic and atraumatic.  Neck: Supple.  Trachea midline   Heart: Regular rate and rhythm.    Lungs:course breath sounds, wheezing, . Equal chest excursion.  + conversational dyspnea.Mild distress noted   Abdomen: Soft, nontender, nondistended belly. Bowel sounds are present   Extremities: No edema    Skin: Warm and dry     Neurologic: speech intact, nonfocal   Psychiatric: Judgment and insight are intact. Mood and affect are appropriate for the situation.    Current Medications:  albuterol 90 mcg per inhalation oral inhaler - "Respiratory to administer", 2 Puff, Inhalation, Q4H PRN  cefTRIAXone (ROCEPHIN) 1 g in NS 50 mL IVPB minibag, 1 g, Intravenous, Q24H  D5W 1/2 NS premix infusion, , Intravenous, Continuous  dexAMETHasone (PF) 10 mg/mL injection, 6 mg, Intravenous, Daily  dextrose 50% (0.5 g/mL) injection - syringe, 25 g, Intravenous, Q15 Min PRN   Or  dextrose 50% (0.5 g/mL) injection - syringe, 12.5 g, Intravenous, Q15 Min PRN   Or  glucagon (GLUCAGEN DIAGNOSTIC KIT) injection 1 mg, 1 mg, Subcutaneous, Q15 Min PRN    Or  glucagon (GLUCAGEN DIAGNOSTIC KIT) injection 1 mg, 1 mg, IntraMUSCULAR, Q15 Min PRN  doxycycline tablet, 100 mg, Oral, 2x/day  enoxaparin PF (LOVENOX) 40 mg/0.4 mL SubQ injection, 40 mg, Subcutaneous, Q24H  ipratropium-albuterol 0.5 mg-3 mg(2.5 mg base)/3 mL Solution for Nebulization, 3 mL, Nebulization, 4x/day  remdesivir 100 mg in NS 250 mL (tot vol) IVPB, 100 mg, Intravenous, Q24H  SSIP insulin R human (HumuLIN R) 100 units/mL injection, 0-6 Units, Subcutaneous, 4x/day PRN        Current Orders:  Active Orders   Imaging    CTA CHEST FOR PULMONARY EMBOLUS AND CT ABD/PEL W IV CONTRAST     Frequency: ONE TIME     Number of Occurrences: 1 Occurrences     Scheduling Instructions:      Isolation Status:  Enhanced Droplet         Lab    CBC     Frequency: ONE TIME     Number of Occurrences: 1 Occurrences    CBC     Frequency: ONE TIME     Number of Occurrences: 1 Occurrences    CBC     Frequency: ONE TIME     Number of Occurrences: 1 Occurrences    COMPREHENSIVE METABOLIC PANEL, NON-FASTING     Frequency: ONE TIME  Number of Occurrences: 1 Occurrences    COMPREHENSIVE METABOLIC PANEL, NON-FASTING     Frequency: ONE TIME     Number of Occurrences: 1 Occurrences    COMPREHENSIVE METABOLIC PANEL, NON-FASTING     Frequency: ONE TIME     Number of Occurrences: 1 Occurrences    MAGNESIUM     Frequency: ONE TIME     Number of Occurrences: 1 Occurrences    MAGNESIUM     Frequency: ONE TIME     Number of Occurrences: 1 Occurrences    MAGNESIUM     Frequency: ONE TIME     Number of Occurrences: 1 Occurrences   Diet    DIET REGULAR Do you want to initiate MNT Protocol? Yes     Frequency: All Meals     Number of Occurrences: 1 Occurrences   Nursing    APPLY SEQUENTIAL COMPRESSION DEVICE     Frequency: ONE TIME     Number of Occurrences: 1 Occurrences    MAINTAIN SEQUENTIAL COMPRESSION DEVICE     Frequency: CONTINUOUS     Number of Occurrences: Until Specified    MISCELLANEOUS MD/DO TO NURSE     Frequency: UNTIL DISCONTINUED      Number of Occurrences: Until Specified     Order Comments: Please get patient mashed potatoes to eat . Thank you      TELEMETRY MONITORING - Continuous     Frequency: CONTINUOUS     Number of Occurrences: Until Specified   Isolation    ENHANCED DROPLET ISOLATION     Frequency: CONTINUOUS     Number of Occurrences: Until Specified     Order Comments: Private room  Wear a fitted N95/MSA/CAPR when entering room  Wear gown, gloves, and eye protection as indicated  Prior to transport, notify receiving department of precautions  Prior to transport, ensure patient is wearing a mask and follows Respiratory Hygiene/Cough Etiquette         Respiratory Care    OXYGEN - NASAL CANNULA     Frequency: CONTINUOUS     Number of Occurrences: Until Specified     Order Comments: Flowrate should not exeed 6L/min except [facility] facility.     Titrate to keep O2 sat 88-92%     Point of Care Testing    PERFORM POC WHOLE BLOOD GLUCOSE     Frequency: TID AC & HS     Number of Occurrences: Until Specified   Admission    PATIENT CLASS/LEVEL OF CARE DESIGNATION - PRN     Frequency: ONE TIME     Number of Occurrences: 1 Occurrences     Order Comments: I certify hospital services are required for this patient based on their diagnosis, age, co-morbidities, and risk of adverse event if discharged.  All hospital services provided to the patient will be in accordance to 42 CFR 412.3.  Information regarding the patients diagnosis, testing, and treatment may be found in the H&P and subsequent progress notes.       Medications    albuterol 90 mcg per inhalation oral inhaler - "Respiratory to administer"     Frequency: Q4H PRN     Dose: 2 Puff     Route: Inhalation    cefTRIAXone (ROCEPHIN) 1 g in NS 50 mL IVPB minibag     Frequency: Q24H     Dose: 1 g     Route: Intravenous    D5W 1/2 NS premix infusion     Frequency: Continuous  Route: Intravenous    dexAMETHasone (PF) 10 mg/mL injection     Frequency: Daily     Dose: 6 mg     Route: Intravenous     dextrose 50% (0.5 g/mL) injection - syringe     Linked Order: Or     Frequency: Q15 Min PRN     Dose: 25 g     Route: Intravenous    dextrose 50% (0.5 g/mL) injection - syringe     Linked Order: Or     Frequency: Q15 Min PRN     Dose: 12.5 g     Route: Intravenous    doxycycline tablet     Frequency: 2x/day     Dose: 100 mg     Route: Oral    enoxaparin PF (LOVENOX) 40 mg/0.4 mL SubQ injection     Frequency: Q24H     Dose: 40 mg     Route: Subcutaneous    glucagon (GLUCAGEN DIAGNOSTIC KIT) injection 1 mg     Linked Order: Or     Frequency: Q15 Min PRN     Dose: 1 mg     Route: Subcutaneous    glucagon (GLUCAGEN DIAGNOSTIC KIT) injection 1 mg     Linked Order: Or     Frequency: Q15 Min PRN     Dose: 1 mg     Route: IntraMUSCULAR    ipratropium-albuterol 0.5 mg-3 mg(2.5 mg base)/3 mL Solution for Nebulization     Frequency: 4x/day     Dose: 3 mL     Route: Nebulization    remdesivir 100 mg in NS 250 mL (tot vol) IVPB     Frequency: Q24H     Dose: 100 mg     Route: Intravenous    SSIP insulin R human (HumuLIN R) 100 units/mL injection     Frequency: 4x/day PRN     Dose: 0-6 Units     Route: Subcutaneous        Consults: none    I/O:  I/O last 24 hours:    Intake/Output Summary (Last 24 hours) at 03/16/2022 1127  Last data filed at 03/16/2022 1030  Gross per 24 hour   Intake 610 ml   Output --   Net 610 ml     I/O current shift:  12/13 0700 - 12/13 1859  In: 200   Out: -       Labs (Please indicate ordered or reviewed)  Reviewed: Lab Results Today:   Results for orders placed or performed during the hospital encounter of 03/15/22 (from the past 24 hour(s))   ECG 12 LEAD   Result Value Ref Range    Ventricular rate 115 BPM    Atrial Rate 115 BPM    PR Interval 154 ms    QRS Duration 112 ms    QT Interval 342 ms    QTC Calculation 473 ms    Calculated P Axis 71 degrees    Calculated R Axis 101 degrees    Calculated T Axis 58 degrees   ARTERIAL BLOOD GAS/LACTATE   Result Value Ref Range    PH (ARTERIAL) 7.36 7.35 - 7.45     PCO2 (ARTERIAL) 48 (H) 35 - 45 mm/Hg    BICARBONATE (ARTERIAL) 25.3 20.0 - 26.0 mmol/L    BASE EXCESS (ARTERIAL) 1.9 0.0 - 2.0 mmol/L    MET-HEMOGLOBIN 0.4 <=2.0 %    LACTATE 0.6 <=2.0 mmol/L    CARBOXYHEMOGLOBIN 1.4 <=1.5 %    O2CT 14.1 %    %  FIO2 (ARTERIAL) 21 %    PO2 (ARTERIAL) 53 (L) 80 - 100 mm/Hg    OXYHEMOGLOBIN 84.6 (LL) 88.0 - 100.0 %    ALLEN TEST yes     DRAW SITE left radial    URINE DRUG SCREEN   Result Value Ref Range    AMPHET QL Negative Negative    BARB QL Negative Negative    BENZO QL Negative Negative    BUP QL Negative Negative    CANNAQL Negative Negative    COCQL Negative Negative    FENTANYL, RANDOM URINE Negative Negative    OPIATE Positive (A) Negative    OXYCODONE URINE Positive (A) Negative    PCP QL Negative Negative    METHQL Negative Negative   URINALYSIS, MACROSCOPIC   Result Value Ref Range    COLOR Light Yellow Colorless, Light Yellow, Yellow    APPEARANCE Clear Clear    SPECIFIC GRAVITY 1.011 1.002 - 1.030    PH 6.0 5.0 - 9.0    LEUKOCYTES Negative Negative, 100  WBCs/uL    NITRITE Negative Negative    PROTEIN Negative Negative, 10 , 20  mg/dL    GLUCOSE Negative Negative, 30  mg/dL    KETONES Negative Negative, Trace mg/dL    BILIRUBIN Negative Negative, 0.5 mg/dL    BLOOD Negative Negative, 0.03 mg/dL    UROBILINOGEN Normal Normal mg/dL   URINALYSIS, MICROSCOPIC   Result Value Ref Range    RBCS <1 <4 /hpf    WBCS <1 <6 /hpf   COMPREHENSIVE METABOLIC PANEL, NON-FASTING   Result Value Ref Range    SODIUM 146 (H) 136 - 145 mmol/L    POTASSIUM 3.4 (L) 3.5 - 5.1 mmol/L    CHLORIDE 111 (H) 98 - 107 mmol/L    CO2 TOTAL 27 21 - 31 mmol/L    ANION GAP 8 4 - 13 mmol/L    BUN 29 (H) 7 - 25 mg/dL    CREATININE 0.94 0.60 - 1.30 mg/dL    BUN/CREA RATIO 31 (H) 6 - 22    ESTIMATED GFR 64 >59 mL/min/1.27m2    ALBUMIN 3.5 3.5 - 5.7 g/dL    CALCIUM 8.2 (L) 8.6 - 10.3 mg/dL    GLUCOSE 109 74 - 109 mg/dL    ALKALINE PHOSPHATASE 68 34 - 104 U/L    ALT (SGPT) 11 7 - 52 U/L    AST (SGOT) 16 13 - 39 U/L     BILIRUBIN TOTAL 0.2 (L) 0.3 - 1.2 mg/dL    PROTEIN TOTAL 6.4 6.4 - 8.9 g/dL    ALBUMIN/GLOBULIN RATIO 1.2 0.8 - 1.4    OSMOLALITY, CALCULATED 297 (H) 270 - 290 mOsm/kg    CALCIUM, CORRECTED 8.6 (L) 8.9 - 10.8 mg/dL    GLOBULIN 2.9 2.9 - 5.4   ETHANOL, SERUM/PLASMA   Result Value Ref Range    ETHANOL <10 (H) 0 mg/dL   LACTIC ACID LEVEL W/ REFLEX FOR LEVEL >2.0   Result Value Ref Range    LACTIC ACID 0.5 0.5 - 2.2 mmol/L   CBC WITH DIFF   Result Value Ref Range    WBC 1.4 (LL) 3.8 - 11.8 x10^3/uL    RBC 3.53 (L) 3.63 - 4.92 x10^6/uL    HGB 10.3 (L) 10.9 - 14.3 g/dL    HCT 30.4 (L) 31.2 - 41.9 %    MCV 86.2 75.5 - 95.3 fL    MCH 29.3 24.7 - 32.8 pg    MCHC 33.9 32.3 - 35.6 g/dL  RDW 15.2 12.3 - 17.7 %    PLATELETS 94 (L) 140 - 440 x10^3/uL    MPV 7.1 (L) 7.9 - 10.8 fL   MANUAL DIFFERENTIAL   Result Value Ref Range    WBC 1.4 x10^3/uL    NEUTROPHIL % 71 40 - 76 %    LYMPHOCYTE % 19 (L) 25 - 45 %    MONOCYTE % 9 0 - 12 %    EOSINOPHIL % 1 0 - 7 %    BASOPHIL %      METAMYELOCYTE %      MYELOCYTE %      PROMYELOCYTE %      BAND %      BLAST %      OTHER %      NEUTROPHIL ABSOLUTE 0.99 (L) 1.80 - 8.40 x10^3/uL    LYMPHOCYTE ABSOLUTE 0.27 (L) 1.10 - 5.00 x10^3/uL    MONOCYTE ABSOLUTE 0.13 0.00 - 1.30 x10^3/uL    EOSINOPHIL ABSOLUTE 0.01 0.00 - 0.80 x10^3/uL    BASOPHIL ABSOLUTE      METAMYELOCYTE ABSOLUTE      MYELOCYTE ABSOLUTE      PROMYELOCYTE ABSOLUTE      BLAST ABSOLUTE      OTHER CELL ABSOLUTE      ANISOCYTOSIS      POLYCHROMASIA      POIKILOCYTOSIS      BASOPHILIC STIPPLING      MICROCYTOSIS      MACROCYTOSIS      ROULEAUX      SCHISTOCYTES      SPHEROCYTES      TARGET CELLS      TEARDROP CELLS      OVALOCYTE (ELLIPTOCYTE)      CRENATED RED CELLS      STOMATOCYTES      ACANTHOCYTES (SPUR CELL)      ECHINOCYTE (BURR CELL)      BLISTER CELLS      RBC AGGLUTINATES      HOWELL JOLLY BODIES      ATYPICAL LYMPHOCYTES      TOXIC GRANULATION      DOHLE BODIES      TOXIC VACUOLIZATION      AUER RODS      BASKET CELLS       HYPERSEGMENTATION      LARGE PLATELETS      PLATELET CLUMPS      WBC MORPHOLOGY COMMENT      RBC MORPHOLOGY COMMENT Normal     PLATELET MORPHOLOGY COMMENT Decreased     BANDS NEUTROPHILS MANUAL      BAND ABSOLUTE      NEUTROPHILS MANUAL 71     LYMPHOCYTES MANUAL 19     MONOCYTES MANUAL 9     EOSINOPHILS MANUAL 1     BASOPHILS MANUAL      PROMYELOCYTES MANUAL      MYELOCYTES MANUAL      METAMYELOCYTES MANUAL      BLASTS MANUAL      TOTAL CELLS COUNTED [#] IN BLOOD 100     OTHER CELLS MANUAL      NUCLEATED RBC MANUAL      PLASMA CELL %      PLASMA CELL ABSOLUE      PLASMA CELLS MANUAL      HYPOCHROMASIA     COVID-19, FLU A/B, RSV RAPID BY PCR   Result Value Ref Range    SARS-CoV-2 Detected (A) Not Detected    INFLUENZA VIRUS TYPE A Not Detected Not  Detected    INFLUENZA VIRUS TYPE B Not Detected Not Detected    RESPIRATORY SYNCTIAL VIRUS (RSV) Not Detected Not Detected   D-DIMER   Result Value Ref Range    D-DIMER 1,160 (HH) 215 - 500 ng/mL FEU   MAGNESIUM   Result Value Ref Range    MAGNESIUM 1.7 (L) 1.9 - 2.7 mg/dL   BASIC METABOLIC PANEL   Result Value Ref Range    SODIUM 147 (H) 136 - 145 mmol/L    POTASSIUM 4.0 3.5 - 5.1 mmol/L    CHLORIDE 112 (H) 98 - 107 mmol/L    CO2 TOTAL 27 21 - 31 mmol/L    ANION GAP 8 4 - 13 mmol/L    CALCIUM 8.5 (L) 8.6 - 10.3 mg/dL    GLUCOSE 169 (H) 74 - 109 mg/dL    BUN 22 7 - 25 mg/dL    CREATININE 0.77 0.60 - 1.30 mg/dL    BUN/CREA RATIO 29 (H) 6 - 22    ESTIMATED GFR 82 >59 mL/min/1.7m2    OSMOLALITY, CALCULATED 300 (H) 270 - 290 mOsm/kg   CBC WITH DIFF   Result Value Ref Range    WBC 0.8 (LL) 3.8 - 11.8 x10^3/uL    RBC 4.13 3.63 - 4.92 x10^6/uL    HGB 12.0 10.9 - 14.3 g/dL    HCT 35.7 31.2 - 41.9 %    MCV 86.4 75.5 - 95.3 fL    MCH 29.0 24.7 - 32.8 pg    MCHC 33.5 32.3 - 35.6 g/dL    RDW 15.2 12.3 - 17.7 %    PLATELETS 103 (L) 140 - 440 x10^3/uL    MPV 7.2 (L) 7.9 - 10.8 fL   MANUAL DIFFERENTIAL   Result Value Ref Range    WBC 0.8 x10^3/uL    NEUTROPHIL % 55 40 - 76 %     LYMPHOCYTE % 32 25 - 45 %    MONOCYTE % 12 0 - 12 %    EOSINOPHIL % 1 0 - 7 %    BASOPHIL %      METAMYELOCYTE %      MYELOCYTE %      PROMYELOCYTE %      BAND %      BLAST %      OTHER %      NEUTROPHIL ABSOLUTE 0.44 (LL) 1.80 - 8.40 x10^3/uL    LYMPHOCYTE ABSOLUTE 0.26 (L) 1.10 - 5.00 x10^3/uL    MONOCYTE ABSOLUTE 0.10 0.00 - 1.30 x10^3/uL    EOSINOPHIL ABSOLUTE 0.01 0.00 - 0.80 x10^3/uL    BASOPHIL ABSOLUTE      METAMYELOCYTE ABSOLUTE      MYELOCYTE ABSOLUTE      PROMYELOCYTE ABSOLUTE      BLAST ABSOLUTE      OTHER CELL ABSOLUTE      ANISOCYTOSIS 1+ (10-25%)     POLYCHROMASIA      POIKILOCYTOSIS      BASOPHILIC STIPPLING      MICROCYTOSIS      MACROCYTOSIS      ROULEAUX      SCHISTOCYTES      SPHEROCYTES      TARGET CELLS      TEARDROP CELLS      OVALOCYTE (ELLIPTOCYTE)      CRENATED RED CELLS      STOMATOCYTES      ACANTHOCYTES (SPUR CELL)      ECHINOCYTE (BURR CELL)      BLISTER CELLS  RBC AGGLUTINATES      HOWELL JOLLY BODIES      ATYPICAL LYMPHOCYTES      TOXIC GRANULATION      DOHLE BODIES      TOXIC VACUOLIZATION      AUER RODS      BASKET CELLS      HYPERSEGMENTATION      LARGE PLATELETS      PLATELET CLUMPS      WBC MORPHOLOGY COMMENT      RBC MORPHOLOGY COMMENT      PLATELET MORPHOLOGY COMMENT Adequate     BANDS NEUTROPHILS MANUAL      BAND ABSOLUTE      NEUTROPHILS MANUAL 55     LYMPHOCYTES MANUAL 32     MONOCYTES MANUAL 12     EOSINOPHILS MANUAL 1     BASOPHILS MANUAL      PROMYELOCYTES MANUAL      MYELOCYTES MANUAL      METAMYELOCYTES MANUAL      BLASTS MANUAL      TOTAL CELLS COUNTED [#] IN BLOOD 100     OTHER CELLS MANUAL      NUCLEATED RBC MANUAL      PLASMA CELL %      PLASMA CELL ABSOLUE      PLASMA CELLS MANUAL      HYPOCHROMASIA     POC BLOOD GLUCOSE (RESULTS)   Result Value Ref Range    GLUCOSE, POC 94 50 - 500 mg/dl     Ordered:  am abs     Radiology Tests (Please indicate ordered or reviewed)  Reviewed: all imaging reviewed   Ordered:  CTA     Problem List:  Active Hospital Problems    (*Primary Problem)    Diagnosis    *Acute respiratory failure due to COVID-19 (CMS HCC)    Toxic encephalopathy    COPD (chronic obstructive pulmonary disease) (CMS HCC)    Chronic pain disorder         Assessment/ Plan:   Acute respiratory failure due to COVID 19  -Remdesivir  -decadron  -neb treatments   -supportive care   -Wean O2 to maintain saturation 89-92%     Possible early PNA on CXR   -CTA r/o PE and look further for PNA.   -Continue Rocephin and Doxycycline    Toxic encephalopathy  -Most likely due to PNA/COVID   -Resolved     Continue home medications for chronic conditions and monitor.    The Hospitalist personally evaluated and examined the patient in conjunction with the MLP and agree with the assessments, treatment plan and disposition of the patient as recorded by the [facility].      DVT/PE Prophylaxis: Lovenox     Melissa D Lilly, PA-C        Plan of care discussed as documented above.  Patient and her daughter are having concerns about nursing care.  I have advised them to talk to the nursing supervisor.    [place], [facility] H[facility]PITALIST

## 2022-03-16 NOTE — ED Nurses Note (Signed)
Patient up to bedside commode three times since 0645. When up to bedside commode patients respiratory rate increases to the 50's. 02 saturation low 90's on 2LPM. Patient explained that we could try a bed pan or pure wick to prevent frequency of getting out of bed. Patient agrees to pure wick. Placed at this time.

## 2022-03-17 ENCOUNTER — Encounter (HOSPITAL_COMMUNITY): Payer: Self-pay | Admitting: Internal Medicine

## 2022-03-17 DIAGNOSIS — U071 COVID-19: Secondary | ICD-10-CM

## 2022-03-17 DIAGNOSIS — J449 Chronic obstructive pulmonary disease, unspecified: Secondary | ICD-10-CM

## 2022-03-17 DIAGNOSIS — G929 Unspecified toxic encephalopathy: Secondary | ICD-10-CM

## 2022-03-17 LAB — CBC
HCT: 35.9 % (ref 31.2–41.9)
HGB: 12 g/dL (ref 10.9–14.3)
MCH: 28.7 pg (ref 24.7–32.8)
MCHC: 33.4 g/dL (ref 32.3–35.6)
MCV: 85.7 fL (ref 75.5–95.3)
MPV: 6.4 fL — ABNORMAL LOW (ref 7.9–10.8)
PLATELETS: 121 10*3/uL — ABNORMAL LOW (ref 140–440)
RBC: 4.19 10*6/uL (ref 3.63–4.92)
RDW: 15.1 % (ref 12.3–17.7)
WBC: 2.1 10*3/uL — ABNORMAL LOW (ref 3.8–11.8)

## 2022-03-17 LAB — BLOOD CULTURE ID GRAM POSITIVE
ENTEROCOCCUS FAECALIS: NOT DETECTED
ENTEROCOCCUS FAECIUM: NOT DETECTED
LISTERIA SPECIES: NOT DETECTED
STAPHYLOCOCCUS AUREUS: NOT DETECTED
STAPHYLOCOCCUS EPIDERMIDIS: NOT DETECTED
STAPHYLOCOCCUS LUGDUNENSIS: NOT DETECTED
STAPHYLOCOCCUS SPECIES: NOT DETECTED
STREPTOCOCCUS AGALACTIAE: NOT DETECTED
STREPTOCOCCUS ANGINOSUS: NOT DETECTED
STREPTOCOCCUS PNEUMONIAE: NOT DETECTED
STREPTOCOCCUS PYOGENES: NOT DETECTED
STREPTOCOCCUS SPECIES: DETECTED — AB

## 2022-03-17 LAB — COMPREHENSIVE METABOLIC PANEL, NON-FASTING
ALBUMIN/GLOBULIN RATIO: 1.1 (ref 0.8–1.4)
ALBUMIN: 3.7 g/dL (ref 3.5–5.7)
ALKALINE PHOSPHATASE: 73 U/L (ref 34–104)
ALT (SGPT): 15 U/L (ref 7–52)
ANION GAP: 6 mmol/L (ref 4–13)
AST (SGOT): 19 U/L (ref 13–39)
BILIRUBIN TOTAL: 0.3 mg/dL (ref 0.3–1.2)
BUN/CREA RATIO: 23 — ABNORMAL HIGH (ref 6–22)
BUN: 16 mg/dL (ref 7–25)
CALCIUM, CORRECTED: 7.8 mg/dL — ABNORMAL LOW (ref 8.9–10.8)
CALCIUM: 7.6 mg/dL — ABNORMAL LOW (ref 8.6–10.3)
CHLORIDE: 104 mmol/L (ref 98–107)
CO2 TOTAL: 31 mmol/L (ref 21–31)
CREATININE: 0.71 mg/dL (ref 0.60–1.30)
ESTIMATED GFR: 90 mL/min/{1.73_m2} (ref >59)
GLOBULIN: 3.3 (ref 2.9–5.4)
GLUCOSE: 105 mg/dL (ref 74–109)
OSMOLALITY, CALCULATED: 283 mOsm/kg (ref 270–290)
POTASSIUM: 3 mmol/L — ABNORMAL LOW (ref 3.5–5.1)
PROTEIN TOTAL: 7 g/dL (ref 6.4–8.9)
SODIUM: 141 mmol/L (ref 136–145)

## 2022-03-17 LAB — POC BLOOD GLUCOSE (RESULTS): GLUCOSE, POC: 161 mg/dl (ref 50–500)

## 2022-03-17 LAB — MAGNESIUM: MAGNESIUM: 1.7 mg/dL — ABNORMAL LOW (ref 1.9–2.7)

## 2022-03-17 MED ORDER — NYSTATIN 100,000 UNIT/ML ORAL SUSPENSION
5.0000 mL | Freq: Four times a day (QID) | ORAL | Status: DC
Start: 2022-03-17 — End: 2022-03-19
  Administered 2022-03-17 – 2022-03-19 (×9): 5 mL via ORAL
  Administered 2022-03-19: 0 mL via ORAL
  Filled 2022-03-17 (×10): qty 5

## 2022-03-17 MED ORDER — FUROSEMIDE 40 MG TABLET
ORAL_TABLET | ORAL | Status: AC
Start: 2022-03-17 — End: 2022-03-17
  Filled 2022-03-17: qty 1

## 2022-03-17 MED ORDER — ZINC SULFATE 50 MG ZINC (220 MG) CAPSULE
ORAL_CAPSULE | ORAL | Status: AC
Start: 2022-03-17 — End: 2022-03-17
  Filled 2022-03-17: qty 1

## 2022-03-17 MED ORDER — MAGNESIUM SULFATE 1 GRAM/100 ML IN DEXTROSE 5 % INTRAVENOUS PIGGYBACK
1.0000 g | INJECTION | Freq: Once | INTRAVENOUS | Status: AC
Start: 2022-03-17 — End: 2022-03-17
  Administered 2022-03-17: 1 g via INTRAVENOUS
  Administered 2022-03-17: 0 g via INTRAVENOUS

## 2022-03-17 MED ORDER — DEXAMETHASONE SODIUM PHOSPHATE (PF) 10 MG/ML INJECTION SOLUTION
INTRAMUSCULAR | Status: AC
Start: 2022-03-17 — End: 2022-03-17
  Filled 2022-03-17: qty 1

## 2022-03-17 MED ORDER — MAGNESIUM SULFATE 1 GRAM/100 ML IN DEXTROSE 5 % INTRAVENOUS PIGGYBACK
INJECTION | INTRAVENOUS | Status: AC
Start: 2022-03-17 — End: 2022-03-17
  Filled 2022-03-17: qty 100

## 2022-03-17 MED ORDER — POTASSIUM CHLORIDE ER 20 MEQ TABLET,EXTENDED RELEASE(PART/CRYST)
20.0000 meq | ORAL_TABLET | Freq: Two times a day (BID) | ORAL | Status: AC
Start: 2022-03-17 — End: 2022-03-19
  Administered 2022-03-17 – 2022-03-19 (×4): 20 meq via ORAL
  Filled 2022-03-17 (×4): qty 1

## 2022-03-17 MED ORDER — ENOXAPARIN 40 MG/0.4 ML SUBCUTANEOUS SYRINGE
INJECTION | SUBCUTANEOUS | Status: AC
Start: 2022-03-17 — End: 2022-03-17
  Filled 2022-03-17: qty 0.4

## 2022-03-17 MED ORDER — DOXYCYCLINE HYCLATE 100 MG TABLET
ORAL_TABLET | ORAL | Status: AC
Start: 2022-03-17 — End: 2022-03-17
  Filled 2022-03-17: qty 1

## 2022-03-17 MED ORDER — FERROUS SULFATE 324 MG (65 MG IRON) TABLET,DELAYED RELEASE
DELAYED_RELEASE_TABLET | ORAL | Status: AC
Start: 2022-03-17 — End: 2022-03-17
  Filled 2022-03-17: qty 1

## 2022-03-17 MED ORDER — GABAPENTIN 600 MG TABLET
ORAL_TABLET | ORAL | Status: AC
Start: 2022-03-17 — End: 2022-03-17
  Filled 2022-03-17: qty 1

## 2022-03-17 MED ORDER — CHOLECALCIFEROL (VITAMIN D3) 25 MCG (1,000 UNIT) TABLET
ORAL_TABLET | ORAL | Status: AC
Start: 2022-03-17 — End: 2022-03-17
  Filled 2022-03-17: qty 1

## 2022-03-17 MED ORDER — OXYCODONE ER 10 MG TABLET,CRUSH RESISTANT,EXTENDED RELEASE 12 HR
EXTENDED_RELEASE_ORAL_TABLET | ORAL | Status: AC
Start: 2022-03-17 — End: 2022-03-17
  Filled 2022-03-17: qty 1

## 2022-03-17 MED ORDER — ASCORBIC ACID (VITAMIN C) 500 MG TABLET
ORAL_TABLET | ORAL | Status: AC
Start: 2022-03-17 — End: 2022-03-17
  Filled 2022-03-17: qty 1

## 2022-03-17 MED ORDER — ONDANSETRON HCL (PF) 4 MG/2 ML INJECTION SOLUTION
INTRAMUSCULAR | Status: AC
Start: 2022-03-17 — End: 2022-03-17
  Filled 2022-03-17: qty 2

## 2022-03-17 NOTE — Care Plan (Signed)
Pt. Admitted to [facility]. Pt. Given PRN Norco. Care plan ongoing.  Problem: Adult Inpatient Plan of Care  Goal: Plan of Care Review  Outcome: Ongoing (see interventions/notes)  Goal: Patient-Specific Goal (Individualized)  Outcome: Ongoing (see interventions/notes)  Flowsheets (Taken 03/17/2022 1200)  Individualized Care Needs: Monitor oxygen saturation  Anxieties, Fears or Concerns: To get better  Patient-Specific Goals (Include Timeframe): Go home when better  Plan of Care Reviewed With: patient  Goal: Absence of Hospital-Acquired Illness or Injury  Outcome: Ongoing (see interventions/notes)  Goal: Optimal Comfort and Wellbeing  Outcome: Ongoing (see interventions/notes)  Goal: Rounds/Family Conference  Outcome: Ongoing (see interventions/notes)     Problem: Health Knowledge, Opportunity to Enhance (Adult,Obstetrics,Pediatric)  Goal: Knowledgeable about Health Subject/Topic  Description: Patient will demonstrate the desired outcomes by discharge/transition of care.  Outcome: Ongoing (see interventions/notes)

## 2022-03-17 NOTE — ED Nurses Note (Signed)
PT transported to floor. Portable telemetry monitor in use.

## 2022-03-17 NOTE — ED Nurses Note (Signed)
Report called to [address].

## 2022-03-17 NOTE — Progress Notes (Signed)
[facility]   IP PROGRESS NOTE      [name], [name]  Date of Admission:  03/15/2022  Date of Birth:  15-Oct-1950  Date of Service:  03/17/2022    Hospital Day:  LOS: 2 days     Chief Complaint: AMS    HPI:  Laura Oconnell is a 71 y.o. female who is being seen in f/u for acute respiratory failure due to COVID, toxic encephalopathy. Patient is alert and oriented this am. States she feels better overall. Still SOB, requiring 1L O2 Does not use O2 at home. Having some cough and wheezing.     03/17/2022 Patient seen and examined in ER. States she is feeling better.  Receiving breathing treatment .  States breathing treatments give her thrush and her mouth is starting to hurt. Asking for nystatin swish and swallow.     Review of Systems:  Please refer to HPI     Vital Signs:  Temp (24hrs) Max:37 C (98.6 F)      Temperature: 36.9 C (98.5 F)  BP (Non-Invasive): (!) 155/91  MAP (Non-Invasive): 109 mmHG  Heart Rate: (!) 105  Respiratory Rate: (!) 27  SpO2: 96 %    Physical Examination  General: Patient is alert and oriented to person, place, and time. No acute distress. Communicates appropriately.   Head: Normocephalic and atraumatic.  Neck: Supple.  Trachea midline   Heart: tachycardia    Lungs:course breath sounds, wheezing, . Equal chest excursion.  no conversational dyspnea. No  distress noted   Abdomen: Soft, nontender, nondistended belly. Bowel sounds are present   Extremities: No edema    Skin: Warm and dry     Neurologic: speech intact, nonfocal   Psychiatric: Judgment and insight are intact. Mood and affect are appropriate for the situation.    Current Medications:  albuterol 90 mcg per inhalation oral inhaler - "Respiratory to administer", 2 Puff, Inhalation, Q4H PRN  ascorbic acid (VITAMIN C) tablet, 500 mg, Oral, Daily  cefTRIAXone (ROCEPHIN) 1 g in NS 50 mL IVPB minibag, 1 g, Intravenous, Q24H  cholecalciferol (VITAMIN D3) 1000 unit (25 mcg) tablet, 1,000 Units, Oral, Daily  D5W 1/2 NS  premix infusion, , Intravenous, Continuous  dexAMETHasone (PF) 10 mg/mL injection, 6 mg, Intravenous, Daily  dextrose 50% (0.5 g/mL) injection - syringe, 25 g, Intravenous, Q15 Min PRN   Or  dextrose 50% (0.5 g/mL) injection - syringe, 12.5 g, Intravenous, Q15 Min PRN   Or  glucagon (GLUCAGEN DIAGNOSTIC KIT) injection 1 mg, 1 mg, Subcutaneous, Q15 Min PRN   Or  glucagon (GLUCAGEN DIAGNOSTIC KIT) injection 1 mg, 1 mg, IntraMUSCULAR, Q15 Min PRN  dilTIAZem (CARDIZEM SR) 12 hr extended release capsule, 120 mg, Oral, Daily  doxycycline tablet, 100 mg, Oral, 2x/day  enoxaparin PF (LOVENOX) 40 mg/0.4 mL SubQ injection, 40 mg, Subcutaneous, Q24H  ferrous sulfate 324 mg (65 mg elemental IRON) tablet, 324 mg, Oral, Daily  furosemide (LASIX) tablet, 20 mg, Oral, Daily  gabapentin (NEURONTIN) tablet, 600 mg, Oral, 3x/day  HYDROcodone-acetaminophen (NORCO) 5-325 mg per tablet, 1 Tablet, Oral, Q6H PRN  ipratropium-albuterol 0.5 mg-3 mg(2.5 mg base)/3 mL Solution for Nebulization, 3 mL, Nebulization, 4x/day  magnesium sulfate 1 G in D5W 100 mL premix IVPB, 1 g, Intravenous, Once  memantine (NAMENDA) tablet, 10 mg, Oral, 2x/day  nystatin (MYCOSTATIN) 100,000 units per mL oral liquid, 5 mL, Oral, 4x/day  ondansetron (ZOFRAN) 2 mg/mL injection, 4 mg, Intravenous, Q8H PRN  oxyCODONE (oxyCONTIN) 12 hr extended release tablet,  10 mg, Oral, Q12H  PARoxetine (PAXIL) tablet, 40 mg, Oral, QAM  potassium chloride (K-DUR) extended release tablet, 20 mEq, Oral, 2x/day-Food  remdesivir 100 mg in NS 250 mL (tot vol) IVPB, 100 mg, Intravenous, Q24H  rOPINIRole (REQUIP) tablet, 0.5 mg, Oral, NIGHTLY  SSIP insulin R human (HumuLIN R) 100 units/mL injection, 0-6 Units, Subcutaneous, 4x/day PRN  zinc sulfate (ZINCATE) 220 mg (50 mg elemental zinc) capsule, 50 mg, Oral, Daily        Current Orders:  Active Orders   Microbiology    ADULT ROUTINE BLOOD CULTURE, SET OF 2 ADULT BOTTLES (BACTERIA AND YEAST)     Frequency: ONE TIME     Number of  Occurrences: 1 Occurrences   Lab    CBC     Frequency: ONE TIME     Number of Occurrences: 1 Occurrences    CBC     Frequency: ONE TIME     Number of Occurrences: 1 Occurrences    COMPREHENSIVE METABOLIC PANEL, NON-FASTING     Frequency: ONE TIME     Number of Occurrences: 1 Occurrences    COMPREHENSIVE METABOLIC PANEL, NON-FASTING     Frequency: ONE TIME     Number of Occurrences: 1 Occurrences    MAGNESIUM     Frequency: ONE TIME     Number of Occurrences: 1 Occurrences    MAGNESIUM     Frequency: ONE TIME     Number of Occurrences: 1 Occurrences   Diet    DIET REGULAR Do you want to initiate MNT Protocol? Yes; Special Tray Requirements: DISPOSABLES (paper & plastic)     Frequency: All Meals     Number of Occurrences: 1 Occurrences   Nursing    APPLY SEQUENTIAL COMPRESSION DEVICE     Frequency: ONE TIME     Number of Occurrences: 1 Occurrences    MAINTAIN SEQUENTIAL COMPRESSION DEVICE     Frequency: CONTINUOUS     Number of Occurrences: Until Specified    MISCELLANEOUS MD/DO TO NURSE     Frequency: UNTIL DISCONTINUED     Number of Occurrences: Until Specified     Order Comments: Please get patient mashed potatoes to eat . Thank you      TELEMETRY MONITORING - Continuous     Frequency: CONTINUOUS     Number of Occurrences: Until Specified   Isolation    ENHANCED DROPLET ISOLATION     Frequency: CONTINUOUS     Number of Occurrences: Until Specified     Order Comments: Private room  Wear a fitted N95/MSA/CAPR when entering room  Wear gown, gloves, and eye protection as indicated  Prior to transport, notify receiving department of precautions  Prior to transport, ensure patient is wearing a mask and follows Respiratory Hygiene/Cough Etiquette         Respiratory Care    OXYGEN - NASAL CANNULA     Frequency: CONTINUOUS     Number of Occurrences: Until Specified     Order Comments: Flowrate should not exeed 6L/min except [facility] facility.     Titrate to keep O2 sat 88-92%     Point of Care Testing    PERFORM POC WHOLE BLOOD  GLUCOSE     Frequency: TID AC & HS     Number of Occurrences: Until Specified   Admission    PATIENT CLASS/LEVEL OF CARE DESIGNATION - PRN     Frequency: ONE TIME     Number of Occurrences: 1 Occurrences     Order  Comments: I certify hospital services are required for this patient based on their diagnosis, age, co-morbidities, and risk of adverse event if discharged.  All hospital services provided to the patient will be in accordance to 42 CFR 412.3.  Information regarding the patients diagnosis, testing, and treatment may be found in the H&P and subsequent progress notes.       Medications    albuterol 90 mcg per inhalation oral inhaler - "Respiratory to administer"     Frequency: Q4H PRN     Dose: 2 Puff     Route: Inhalation    ascorbic acid (VITAMIN C) tablet     Frequency: Daily     Dose: 500 mg     Route: Oral    cefTRIAXone (ROCEPHIN) 1 g in NS 50 mL IVPB minibag     Frequency: Q24H     Dose: 1 g     Route: Intravenous    cholecalciferol (VITAMIN D3) 1000 unit (25 mcg) tablet     Frequency: Daily     Dose: 1,000 Units     Route: Oral    D5W 1/2 NS premix infusion     Frequency: Continuous     Route: Intravenous    dexAMETHasone (PF) 10 mg/mL injection     Frequency: Daily     Dose: 6 mg     Route: Intravenous    dextrose 50% (0.5 g/mL) injection - syringe     Linked Order: Or     Frequency: Q15 Min PRN     Dose: 25 g     Route: Intravenous    dextrose 50% (0.5 g/mL) injection - syringe     Linked Order: Or     Frequency: Q15 Min PRN     Dose: 12.5 g     Route: Intravenous    dilTIAZem (CARDIZEM SR) 12 hr extended release capsule     Frequency: Daily     Dose: 120 mg     Route: Oral    doxycycline tablet     Frequency: 2x/day     Dose: 100 mg     Route: Oral    enoxaparin PF (LOVENOX) 40 mg/0.4 mL SubQ injection     Frequency: Q24H     Dose: 40 mg     Route: Subcutaneous    ferrous sulfate 324 mg (65 mg elemental IRON) tablet     Frequency: Daily     Dose: 324 mg     Route: Oral    furosemide (LASIX) tablet      Frequency: Daily     Dose: 20 mg     Route: Oral    gabapentin (NEURONTIN) tablet     Frequency: 3x/day     Dose: 600 mg     Route: Oral    glucagon (GLUCAGEN DIAGNOSTIC KIT) injection 1 mg     Linked Order: Or     Frequency: Q15 Min PRN     Dose: 1 mg     Route: Subcutaneous    glucagon (GLUCAGEN DIAGNOSTIC KIT) injection 1 mg     Linked Order: Or     Frequency: Q15 Min PRN     Dose: 1 mg     Route: IntraMUSCULAR    HYDROcodone-acetaminophen (NORCO) 5-325 mg per tablet     Frequency: Q6H PRN     Dose: 1 Tablet     Route: Oral    ipratropium-albuterol 0.5 mg-3 mg(2.5 mg base)/3 mL Solution for Nebulization     Frequency:  4x/day     Dose: 3 mL     Route: Nebulization    magnesium sulfate 1 G in D5W 100 mL premix IVPB     Frequency: Once     Dose: 1 g     Route: Intravenous    memantine (NAMENDA) tablet     Frequency: 2x/day     Dose: 10 mg     Route: Oral    nystatin (MYCOSTATIN) 100,000 units per mL oral liquid     Frequency: 4x/day     Dose: 5 mL     Route: Oral    ondansetron (ZOFRAN) 2 mg/mL injection     Frequency: Q8H PRN     Dose: 4 mg     Route: Intravenous    oxyCODONE (oxyCONTIN) 12 hr extended release tablet     Frequency: Q12H     Dose: 10 mg     Route: Oral    PARoxetine (PAXIL) tablet     Frequency: QAM     Dose: 40 mg     Route: Oral    potassium chloride (K-DUR) extended release tablet     Frequency: 2x/day-Food     Dose: 20 mEq     Route: Oral    remdesivir 100 mg in NS 250 mL (tot vol) IVPB     Frequency: Q24H     Dose: 100 mg     Route: Intravenous    rOPINIRole (REQUIP) tablet     Frequency: NIGHTLY     Dose: 0.5 mg     Route: Oral    SSIP insulin R human (HumuLIN R) 100 units/mL injection     Frequency: 4x/day PRN     Dose: 0-6 Units     Route: Subcutaneous    zinc sulfate (ZINCATE) 220 mg (50 mg elemental zinc) capsule     Frequency: Daily     Dose: 50 mg     Route: Oral        Consults: none    I/O:  I/O last 24 hours:    Intake/Output Summary (Last 24 hours) at 03/17/2022 1016  Last data  filed at 03/17/2022 0040  Gross per 24 hour   Intake 510 ml   Output --   Net 510 ml     I/O current shift:  12/14 0700 - 12/14 1859  In: 100   Out: -       Labs (Please indicate ordered or reviewed)  Reviewed: Lab Results Today:   Results for orders placed or performed during the hospital encounter of 03/15/22 (from the past 24 hour(s))   POC BLOOD GLUCOSE (RESULTS)   Result Value Ref Range    GLUCOSE, POC 125 50 - 500 mg/dl   POC BLOOD GLUCOSE (RESULTS)   Result Value Ref Range    GLUCOSE, POC 110 50 - 500 mg/dl   CBC   Result Value Ref Range    WBC 2.1 (L) 3.8 - 11.8 x10^3/uL    RBC 4.19 3.63 - 4.92 x10^6/uL    HGB 12.0 10.9 - 14.3 g/dL    HCT 35.9 31.2 - 41.9 %    MCV 85.7 75.5 - 95.3 fL    MCH 28.7 24.7 - 32.8 pg    MCHC 33.4 32.3 - 35.6 g/dL    RDW 15.1 12.3 - 17.7 %    PLATELETS 121 (L) 140 - 440 x10^3/uL    MPV 6.4 (L) 7.9 - 10.8 fL   COMPREHENSIVE METABOLIC PANEL, NON-FASTING   Result Value  Ref Range    SODIUM 141 136 - 145 mmol/L    POTASSIUM 3.0 (L) 3.5 - 5.1 mmol/L    CHLORIDE 104 98 - 107 mmol/L    CO2 TOTAL 31 21 - 31 mmol/L    ANION GAP 6 4 - 13 mmol/L    BUN 16 7 - 25 mg/dL    CREATININE 0.71 0.60 - 1.30 mg/dL    BUN/CREA RATIO 23 (H) 6 - 22    ESTIMATED GFR 90 >59 mL/min/1.82m2    ALBUMIN 3.7 3.5 - 5.7 g/dL    CALCIUM 7.6 (L) 8.6 - 10.3 mg/dL    GLUCOSE 105 74 - 109 mg/dL    ALKALINE PHOSPHATASE 73 34 - 104 U/L    ALT (SGPT) 15 7 - 52 U/L    AST (SGOT) 19 13 - 39 U/L    BILIRUBIN TOTAL 0.3 0.3 - 1.2 mg/dL    PROTEIN TOTAL 7.0 6.4 - 8.9 g/dL    ALBUMIN/GLOBULIN RATIO 1.1 0.8 - 1.4    OSMOLALITY, CALCULATED 283 270 - 290 mOsm/kg    CALCIUM, CORRECTED 7.8 (L) 8.9 - 10.8 mg/dL    GLOBULIN 3.3 2.9 - 5.4   MAGNESIUM   Result Value Ref Range    MAGNESIUM 1.7 (L) 1.9 - 2.7 mg/dL     Ordered:  am abs     Radiology Tests (Please indicate ordered or reviewed)  Reviewed: all imaging reviewed   Ordered:  CTA     Problem List:  Active Hospital Problems   (*Primary Problem)    Diagnosis    *Acute respiratory  failure due to COVID-19 (CMS HCC)    Toxic encephalopathy    COPD (chronic obstructive pulmonary disease) (CMS HCC)    Chronic pain disorder         Assessment/ Plan:   Acute respiratory failure due to COVID 19  -Remdesivir  -decadron  -neb treatments   -supportive care   -Wean O2 to maintain saturation 89-92%     Possible early PNA on CXR   -CTA no PE , there are Bilateral infiltrative opacities most consistent with viral or atypical pneumonia   -Continue  Rocephin and Doxycycline    Toxic encephalopathy  -Most likely due to PNA/COVID   -Resolved     Physical debility  -PT evaluation for discharge disposition planning when more stable and able to participate.    Continue home medications for chronic conditions and monitor.    The Hospitalist personally evaluated and examined the patient in conjunction with the MLP and agree with the assessments, treatment plan and disposition of the [facility]      DVT/PE Prophylaxis: Lovenox     Melissa D Lilly, PA-C      Plan of care discussed as documented above.      MHollis DKennardHOSPITALIST

## 2022-03-18 LAB — COMPREHENSIVE METABOLIC PANEL, NON-FASTING
ALBUMIN/GLOBULIN RATIO: 1.2 (ref 0.8–1.4)
ALBUMIN: 3.5 g/dL (ref 3.5–5.7)
ALKALINE PHOSPHATASE: 71 U/L (ref 34–104)
ALT (SGPT): 15 U/L (ref 7–52)
ANION GAP: 8 mmol/L (ref 4–13)
AST (SGOT): 21 U/L (ref 13–39)
BILIRUBIN TOTAL: 0.2 mg/dL — ABNORMAL LOW (ref 0.3–1.2)
BUN/CREA RATIO: 23 — ABNORMAL HIGH (ref 6–22)
BUN: 19 mg/dL (ref 7–25)
CALCIUM, CORRECTED: 8 mg/dL — ABNORMAL LOW (ref 8.9–10.8)
CALCIUM: 7.6 mg/dL — ABNORMAL LOW (ref 8.6–10.3)
CHLORIDE: 102 mmol/L (ref 98–107)
CO2 TOTAL: 29 mmol/L (ref 21–31)
CREATININE: 0.84 mg/dL (ref 0.60–1.30)
ESTIMATED GFR: 74 mL/min/{1.73_m2} (ref >59)
GLOBULIN: 3 (ref 2.9–5.4)
GLUCOSE: 116 mg/dL — ABNORMAL HIGH (ref 74–109)
OSMOLALITY, CALCULATED: 281 mOsm/kg (ref 270–290)
POTASSIUM: 3.3 mmol/L — ABNORMAL LOW (ref 3.5–5.1)
PROTEIN TOTAL: 6.5 g/dL (ref 6.4–8.9)
SODIUM: 139 mmol/L (ref 136–145)

## 2022-03-18 LAB — CBC
HCT: 34.1 % (ref 31.2–41.9)
HGB: 11.8 g/dL (ref 10.9–14.3)
MCH: 29.8 pg (ref 24.7–32.8)
MCHC: 34.6 g/dL (ref 32.3–35.6)
MCV: 85.9 fL (ref 75.5–95.3)
MPV: 6.4 fL — ABNORMAL LOW (ref 7.9–10.8)
PLATELETS: 126 10*3/uL — ABNORMAL LOW (ref 140–440)
RBC: 3.96 10*6/uL (ref 3.63–4.92)
RDW: 14.9 % (ref 12.3–17.7)
WBC: 2.1 10*3/uL — ABNORMAL LOW (ref 3.8–11.8)

## 2022-03-18 LAB — POC BLOOD GLUCOSE (RESULTS)
GLUCOSE, POC: 96 mg/dl (ref 50–500)
GLUCOSE, POC: 150 mg/dl (ref 50–500)
GLUCOSE, POC: 148 mg/dl (ref 50–500)
GLUCOSE, POC: 141 mg/dl (ref 50–500)

## 2022-03-18 LAB — MAGNESIUM: MAGNESIUM: 1.7 mg/dL — ABNORMAL LOW (ref 1.9–2.7)

## 2022-03-18 MED ORDER — RAMELTEON 8 MG TABLET
8.0000 mg | ORAL_TABLET | Freq: Every evening | ORAL | Status: DC
Start: 2022-03-18 — End: 2022-03-19
  Administered 2022-03-18: 8 mg via ORAL
  Filled 2022-03-18: qty 1

## 2022-03-18 MED ORDER — DILTIAZEM CD 120 MG CAPSULE,EXTENDED RELEASE 24 HR
120.0000 mg | ORAL_CAPSULE | Freq: Every day | ORAL | Status: DC
Start: 2022-03-18 — End: 2022-03-19
  Administered 2022-03-18 – 2022-03-19 (×2): 120 mg via ORAL
  Filled 2022-03-18 (×2): qty 1

## 2022-03-18 MED ORDER — TIOTROPIUM BROMIDE 2.5 MCG/ACTUATION MIST FOR INHALATION
2.0000 | Freq: Every day | RESPIRATORY_TRACT | Status: DC
Start: 2022-03-18 — End: 2022-03-19
  Administered 2022-03-18 – 2022-03-19 (×2): 2 via RESPIRATORY_TRACT
  Filled 2022-03-18: qty 8

## 2022-03-18 MED ORDER — MAGNESIUM SULFATE 1 GRAM/100 ML IN DEXTROSE 5 % INTRAVENOUS PIGGYBACK
1.0000 g | INJECTION | Freq: Once | INTRAVENOUS | Status: AC
Start: 2022-03-18 — End: 2022-03-18
  Administered 2022-03-18: 1 g via INTRAVENOUS
  Administered 2022-03-18: 0 g via INTRAVENOUS
  Filled 2022-03-18: qty 100

## 2022-03-18 MED ORDER — BUDESONIDE-FORMOTEROL HFA 80 MCG-4.5 MCG/ACTUATION AEROSOL INHALER
2.0000 | INHALATION_SPRAY | Freq: Two times a day (BID) | RESPIRATORY_TRACT | Status: DC
Start: 2022-03-18 — End: 2022-03-19
  Administered 2022-03-18: 0 via RESPIRATORY_TRACT
  Administered 2022-03-19: 2 via RESPIRATORY_TRACT

## 2022-03-18 MED ORDER — BACLOFEN 10 MG TABLET
10.0000 mg | ORAL_TABLET | Freq: Three times a day (TID) | ORAL | Status: DC | PRN
Start: 2022-03-18 — End: 2022-03-19
  Administered 2022-03-18 – 2022-03-19 (×4): 10 mg via ORAL
  Filled 2022-03-18 (×4): qty 1

## 2022-03-18 NOTE — Progress Notes (Signed)
[facility]   IP PROGRESS NOTE      Laura Oconnell  Date of Admission:  03/15/2022  Date of Birth:  10/29/1950  Date of Service:  03/18/2022    Hospital Day:  LOS: 3 days     Chief Complaint: AMS    HPI:  Laura Oconnell is a 71 y.o. female who is being seen in f/u for acute respiratory failure due to COVID, toxic encephalopathy. Patient is alert and oriented this am. States she feels better overall. Still SOB, requiring 1L O2 Does not use O2 at home. Having some cough and wheezing.     03/17/2022 Patient seen and examined in ER. States she is feeling better.  Receiving breathing treatment .  States breathing treatments give her thrush and her mouth is starting to hurt. Asking for nystatin swish and swallow.     03/18/2022 Patient seen and examined on 3E.  Patient states that she feels great this am.  Has not been up with PT yet.  She is oxygenating in the mid 90's on 1L O2.  O2 via NC removed and patient drops to the mid 80's.  Wants [place] to consult Dr Sharlette Dense.      Review of Systems:  Please refer to HPI     Vital Signs:  Temp (24hrs) Max:37.4 C (99.4 F)      Temperature: 36.7 C (98.1 F)  BP (Non-Invasive): (!) 162/77  MAP (Non-Invasive): 98 mmHG  Heart Rate: 97  Respiratory Rate: 18  SpO2: 97 %    Physical Examination  General: Patient is alert and oriented to person, place, and time. No acute distress. Communicates appropriately.   Head: Normocephalic and atraumatic.  Neck: Supple.  Trachea midline   Heart: tachycardia    Lungs:Diminished, . Equal chest excursion.  no conversational dyspnea. No  distress noted   Abdomen: Soft, nontender, nondistended belly. Bowel sounds are present   Extremities: No edema    Skin: Warm and dry     Neurologic: speech intact, nonfocal   Psychiatric: Judgment and insight are intact. Mood and affect are appropriate for the situation.    Current Medications:  albuterol 90 mcg per inhalation oral inhaler - "Respiratory to administer", 2 Puff, Inhalation, Q4H  PRN  ascorbic acid (VITAMIN C) tablet, 500 mg, Oral, Daily  baclofen (LIORESAL) tablet, 10 mg, Oral, 3x/day PRN  cefTRIAXone (ROCEPHIN) 1 g in NS 50 mL IVPB minibag, 1 g, Intravenous, Q24H  cholecalciferol (VITAMIN D3) 1000 unit (25 mcg) tablet, 1,000 Units, Oral, Daily  D5W 1/2 NS premix infusion, , Intravenous, Continuous  dexAMETHasone (PF) 10 mg/mL injection, 6 mg, Intravenous, Daily  dextrose 50% (0.5 g/mL) injection - syringe, 25 g, Intravenous, Q15 Min PRN   Or  dextrose 50% (0.5 g/mL) injection - syringe, 12.5 g, Intravenous, Q15 Min PRN   Or  glucagon (GLUCAGEN DIAGNOSTIC KIT) injection 1 mg, 1 mg, Subcutaneous, Q15 Min PRN   Or  glucagon (GLUCAGEN DIAGNOSTIC KIT) injection 1 mg, 1 mg, IntraMUSCULAR, Q15 Min PRN  dilTIAZem (CARDIZEM CD) 24 hr extended release capsule, 120 mg, Oral, Daily  doxycycline tablet, 100 mg, Oral, 2x/day  enoxaparin PF (LOVENOX) 40 mg/0.4 mL SubQ injection, 40 mg, Subcutaneous, Q24H  ferrous sulfate 324 mg (65 mg elemental IRON) tablet, 324 mg, Oral, Daily  furosemide (LASIX) tablet, 20 mg, Oral, Daily  gabapentin (NEURONTIN) tablet, 600 mg, Oral, 3x/day  HYDROcodone-acetaminophen (NORCO) 5-325 mg per tablet, 1 Tablet, Oral, Q6H PRN  ipratropium-albuterol 0.5 mg-3 mg(2.5 mg base)/3  mL Solution for Nebulization, 3 mL, Nebulization, 4x/day  magnesium sulfate 1 G in D5W 100 mL premix IVPB, 1 g, Intravenous, Once  memantine (NAMENDA) tablet, 10 mg, Oral, 2x/day  nystatin (MYCOSTATIN) 100,000 units per mL oral liquid, 5 mL, Oral, 4x/day  ondansetron (ZOFRAN) 2 mg/mL injection, 4 mg, Intravenous, Q8H PRN  oxyCODONE (oxyCONTIN) 12 hr extended release tablet, 10 mg, Oral, Q12H  PARoxetine (PAXIL) tablet, 40 mg, Oral, QAM  potassium chloride (K-DUR) extended release tablet, 20 mEq, Oral, 2x/day-Food  remdesivir 100 mg in NS 250 mL (tot vol) IVPB, 100 mg, Intravenous, Q24H  rOPINIRole (REQUIP) tablet, 0.5 mg, Oral, NIGHTLY  SSIP insulin R human (HumuLIN R) 100 units/mL injection, 0-6 Units,  Subcutaneous, 4x/day PRN  zinc sulfate (ZINCATE) 220 mg (50 mg elemental zinc) capsule, 50 mg, Oral, Daily        Current Orders:  Active Orders   Microbiology    ADULT ROUTINE BLOOD CULTURE, SET OF 2 ADULT BOTTLES (BACTERIA AND YEAST)     Frequency: ONE TIME     Number of Occurrences: 1 Occurrences   Lab    CBC     Frequency: ONE TIME     Number of Occurrences: 1 Occurrences    COMPREHENSIVE METABOLIC PANEL, NON-FASTING     Frequency: ONE TIME     Number of Occurrences: 1 Occurrences    MAGNESIUM     Frequency: ONE TIME     Number of Occurrences: 1 Occurrences   Diet    DIET REGULAR Do you want to initiate MNT Protocol? Yes; Special Tray Requirements: DISPOSABLES (paper & plastic)     Frequency: All Meals     Number of Occurrences: 1 Occurrences   Nursing    APPLY SEQUENTIAL COMPRESSION DEVICE     Frequency: ONE TIME     Number of Occurrences: 1 Occurrences    MAINTAIN SEQUENTIAL COMPRESSION DEVICE     Frequency: CONTINUOUS     Number of Occurrences: Until Specified    MISCELLANEOUS MD/DO TO NURSE     Frequency: UNTIL DISCONTINUED     Number of Occurrences: Until Specified     Order Comments: Please get patient mashed potatoes to eat . Thank you      TELEMETRY MONITORING - Continuous     Frequency: CONTINUOUS     Number of Occurrences: Until Specified   Consult    IP CONSULT TO PALLIATIVE CARE     Frequency: ONE TIME     Number of Occurrences: 1 Occurrences    IP CONSULT TO PULMONOLOGY Requested Provider; Tsosie Billing F     Frequency: ONE TIME     Number of Occurrences: 1 Occurrences   Isolation    ENHANCED DROPLET ISOLATION     Frequency: CONTINUOUS     Number of Occurrences: Until Specified     Order Comments: Private room  Wear a fitted N95/MSA/CAPR when entering room  Wear gown, gloves, and eye protection as indicated  Prior to transport, notify receiving department of precautions  Prior to transport, ensure patient is wearing a mask and follows Respiratory Hygiene/Cough Etiquette         PT    PT EVALUATE  AND TREAT     Frequency: Per Therapist Discretion     Number of Occurrences: 1 Occurrences     Order Comments: If patient's O2 level drops, PT may increase O2 until sats are > 93%.       Scheduling Instructions:      Isolation Status:  Enhanced Droplet         Respiratory Care    OXYGEN - NASAL CANNULA     Frequency: CONTINUOUS     Number of Occurrences: Until Specified     Order Comments: Flowrate should not exeed 6L/min except [facility] facility.     Titrate to keep O2 sat 88-92%     Point of Care Testing    PERFORM POC WHOLE BLOOD GLUCOSE     Frequency: TID AC & HS     Number of Occurrences: Until Specified   Medications    albuterol 90 mcg per inhalation oral inhaler - "Respiratory to administer"     Frequency: Q4H PRN     Dose: 2 Puff     Route: Inhalation    ascorbic acid (VITAMIN C) tablet     Frequency: Daily     Dose: 500 mg     Route: Oral    baclofen (LIORESAL) tablet     Frequency: 3x/day PRN     Dose: 10 mg     Route: Oral    cefTRIAXone (ROCEPHIN) 1 g in NS 50 mL IVPB minibag     Frequency: Q24H     Dose: 1 g     Route: Intravenous    cholecalciferol (VITAMIN D3) 1000 unit (25 mcg) tablet     Frequency: Daily     Dose: 1,000 Units     Route: Oral    D5W 1/2 NS premix infusion     Frequency: Continuous     Route: Intravenous    dexAMETHasone (PF) 10 mg/mL injection     Frequency: Daily     Dose: 6 mg     Route: Intravenous    dextrose 50% (0.5 g/mL) injection - syringe     Linked Order: Or     Frequency: Q15 Min PRN     Dose: 25 g     Route: Intravenous    dextrose 50% (0.5 g/mL) injection - syringe     Linked Order: Or     Frequency: Q15 Min PRN     Dose: 12.5 g     Route: Intravenous    dilTIAZem (CARDIZEM CD) 24 hr extended release capsule     Frequency: Daily     Dose: 120 mg     Route: Oral    doxycycline tablet     Frequency: 2x/day     Dose: 100 mg     Route: Oral    enoxaparin PF (LOVENOX) 40 mg/0.4 mL SubQ injection     Frequency: Q24H     Dose: 40 mg     Route: Subcutaneous    ferrous sulfate 324 mg  (65 mg elemental IRON) tablet     Frequency: Daily     Dose: 324 mg     Route: Oral    furosemide (LASIX) tablet     Frequency: Daily     Dose: 20 mg     Route: Oral    gabapentin (NEURONTIN) tablet     Frequency: 3x/day     Dose: 600 mg     Route: Oral    glucagon (GLUCAGEN DIAGNOSTIC KIT) injection 1 mg     Linked Order: Or     Frequency: Q15 Min PRN     Dose: 1 mg     Route: Subcutaneous    glucagon (GLUCAGEN DIAGNOSTIC KIT) injection 1 mg     Linked Order: Or     Frequency: Q15 Min PRN  Dose: 1 mg     Route: IntraMUSCULAR    HYDROcodone-acetaminophen (NORCO) 5-325 mg per tablet     Frequency: Q6H PRN     Dose: 1 Tablet     Route: Oral    ipratropium-albuterol 0.5 mg-3 mg(2.5 mg base)/3 mL Solution for Nebulization     Frequency: 4x/day     Dose: 3 mL     Route: Nebulization    magnesium sulfate 1 G in D5W 100 mL premix IVPB     Frequency: Once     Dose: 1 g     Route: Intravenous    memantine (NAMENDA) tablet     Frequency: 2x/day     Dose: 10 mg     Route: Oral    nystatin (MYCOSTATIN) 100,000 units per mL oral liquid     Frequency: 4x/day     Dose: 5 mL     Route: Oral    ondansetron (ZOFRAN) 2 mg/mL injection     Frequency: Q8H PRN     Dose: 4 mg     Route: Intravenous    oxyCODONE (oxyCONTIN) 12 hr extended release tablet     Frequency: Q12H     Dose: 10 mg     Route: Oral    PARoxetine (PAXIL) tablet     Frequency: QAM     Dose: 40 mg     Route: Oral    potassium chloride (K-DUR) extended release tablet     Frequency: 2x/day-Food     Dose: 20 mEq     Route: Oral    remdesivir 100 mg in NS 250 mL (tot vol) IVPB     Frequency: Q24H     Dose: 100 mg     Route: Intravenous    rOPINIRole (REQUIP) tablet     Frequency: NIGHTLY     Dose: 0.5 mg     Route: Oral    SSIP insulin R human (HumuLIN R) 100 units/mL injection     Frequency: 4x/day PRN     Dose: 0-6 Units     Route: Subcutaneous    zinc sulfate (ZINCATE) 220 mg (50 mg elemental zinc) capsule     Frequency: Daily     Dose: 50 mg     Route: Oral         Consults: none    I/O:  I/O last 24 hours:    Intake/Output Summary (Last 24 hours) at 03/18/2022 1117  Last data filed at 03/18/2022 0919  Gross per 24 hour   Intake 220 ml   Output 1100 ml   Net -880 ml     I/O current shift:  12/15 0700 - 12/15 1859  In: 220 [P.O.:220]  Out: -       Labs (Please indicate ordered or reviewed)  Reviewed: Lab Results Today:   Results for orders placed or performed during the hospital encounter of 03/15/22 (from the past 24 hour(s))   POC BLOOD GLUCOSE (RESULTS)   Result Value Ref Range    GLUCOSE, POC 161 50 - 500 mg/dl   CBC   Result Value Ref Range    WBC 2.1 (L) 3.8 - 11.8 x10^3/uL    RBC 3.96 3.63 - 4.92 x10^6/uL    HGB 11.8 10.9 - 14.3 g/dL    HCT 34.1 31.2 - 41.9 %    MCV 85.9 75.5 - 95.3 fL    MCH 29.8 24.7 - 32.8 pg    MCHC 34.6 32.3 - 35.6 g/dL    RDW  14.9 12.3 - 17.7 %    PLATELETS 126 (L) 140 - 440 x10^3/uL    MPV 6.4 (L) 7.9 - 10.8 fL   COMPREHENSIVE METABOLIC PANEL, NON-FASTING   Result Value Ref Range    SODIUM 139 136 - 145 mmol/L    POTASSIUM 3.3 (L) 3.5 - 5.1 mmol/L    CHLORIDE 102 98 - 107 mmol/L    CO2 TOTAL 29 21 - 31 mmol/L    ANION GAP 8 4 - 13 mmol/L    BUN 19 7 - 25 mg/dL    CREATININE 0.84 0.60 - 1.30 mg/dL    BUN/CREA RATIO 23 (H) 6 - 22    ESTIMATED GFR 74 >59 mL/min/1.54m2    ALBUMIN 3.5 3.5 - 5.7 g/dL    CALCIUM 7.6 (L) 8.6 - 10.3 mg/dL    GLUCOSE 116 (H) 74 - 109 mg/dL    ALKALINE PHOSPHATASE 71 34 - 104 U/L    ALT (SGPT) 15 7 - 52 U/L    AST (SGOT) 21 13 - 39 U/L    BILIRUBIN TOTAL 0.2 (L) 0.3 - 1.2 mg/dL    PROTEIN TOTAL 6.5 6.4 - 8.9 g/dL    ALBUMIN/GLOBULIN RATIO 1.2 0.8 - 1.4    OSMOLALITY, CALCULATED 281 270 - 290 mOsm/kg    CALCIUM, CORRECTED 8.0 (L) 8.9 - 10.8 mg/dL    GLOBULIN 3.0 2.9 - 5.4   MAGNESIUM   Result Value Ref Range    MAGNESIUM 1.7 (L) 1.9 - 2.7 mg/dL   POC BLOOD GLUCOSE (RESULTS)   Result Value Ref Range    GLUCOSE, POC 96 50 - 500 mg/dl     Ordered:  am abs     Radiology Tests (Please indicate ordered or reviewed)  Reviewed:  all imaging reviewed   Ordered:  CTA     Problem List:  Active Hospital Problems   (*Primary Problem)    Diagnosis    *Acute respiratory failure due to COVID-19 (CMS HCC)    Toxic encephalopathy    COPD (chronic obstructive pulmonary disease) (CMS HCC)    Chronic pain disorder         Assessment/ Plan:   Acute respiratory failure due to COVID 19  -Remdesivir  -decadron  -neb treatments   -supportive care   -Wean O2 to maintain saturation 89-92%     Possible early PNA on CXR   -CTA no PE , there are Bilateral infiltrative opacities most consistent with viral or atypical pneumonia   -Continue  Rocephin and Doxycycline    Toxic encephalopathy  -Most likely due to PNA/COVID   -Resolved     Physical debility  -PT evaluation for discharge disposition planning when more stable and able to participate.      Will consult patient's pulmonologist per her request. If she does well with PT and ok with Dr FSharlette Denseshe can most likely be discharged home tomorrow. Patient will most likely need home O2. She states that she normally saturates in the high 80's at home.     Continue home medications for chronic conditions and monitor.    The Hospitalist personally evaluated and examined the patient in conjunction with the MLP and agree with the assessments, treatment plan and disposition of the patient [facility]      DVT/PE Prophylaxis: Lovenox     Melissa D Lilly, PA-C      Plan of care discussed as documented above.  Family requesting pulmonology evaluation.    MNorman Herrlich MD.  [facility]PITAL[facility]

## 2022-03-18 NOTE — Consults (Signed)
Pulmonary & Critical Care Specialists      REASON FOR CONSULTATION:  1.Acute exacerbation of COPD.    2. COVID-19 infection   3.  Significant osteoporosis   4.  Tobacco abuse   5.  Low weight   6.  Hypomagnesemia  7. Leukopenia (viral  related)  8.Chronic pain syndrome    IMPRESSION:      1.As above    RECOMMENDATIONS:  1. Finish remdesivir.    2..Smoking cessation   3. Ambulation , would recommend pulmonary Hem refer the patient is released  4.Would suspect the patient may qualify for oxygen supplementation the needs to be documented at rest or by by exercise       activity    5.Would recommend triple therapy for COPD Symbicort and Spiriva    HISTORY OF PRESENT ILLNESS:  This is a 71 year old female regular patient we Pulmonary Clinic who has evidence for chronic respiratory failure and has refused oxygen therapy in the past.  This time she comes in with COVID-19 infection.  CT for the most part is unchanged.  She has evidence for ground-glass opacities most likely compatible with air trapping.  She is currently saturating very well on 2 L per nasal cannula.  Unfortunately she is still smoking.  BMI is 20 but has been lower than this.  Patient will definitely benefit from oxygen supplementation at night and cross smoking cessation.  Her husband also has COVID-19 most likely he gave it to her..  The CT of the chest support bronchiectasis        PAST MEDICAL HISTORY:  Past medical history is significant for the following:    Past Medical History:   Diagnosis Date    Bronchiectasis with (acute) exacerbation (CMS HCC) 08/03/2021    Chronic obstructive pulmonary disease with acute exacerbation (CMS HCC) 09/18/2015    Chronic pain disorder 05/29/2015    Chronic, continuous use of opioids 08/03/2021    Closed right hip fracture, initial encounter (CMS [place]) 02/01/2021    Compression fracture 05/29/2015    Formatting of this note might be different from the original. Per Dr. 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           VENTILATOR SETTINGS:     Lab Results   Component Value Date    PCO2 48 (H) 03/15/2022    PO2 53 (L) 03/15/2022             MEDICATIONS:  ascorbic acid (vitamin C), 500 mg, Daily  cefTRIAXone (ROCEPHIN) IV, 1 g, Q24H  cholecalciferol (vitamin D3), 1,000 Units, Daily  dexAMETHasone (DECADRON) injection, 6 mg, Daily  dilTIAZem, 120 mg, Daily  doxycycline, 100 mg, 2x/day  enoxaparin (LOVENOX) subQ injection, 40 mg, Q24H  ferrous sulfate,  324 mg, Daily  furosemide, 20 mg, Daily  gabapentin, 600 mg, 3x/day  ipratropium-albuteroL, 3 mL, 4x/day  memantine, 10 mg, 2x/day  nystatin, 5 mL, 4x/day  oxyCODONE, 10 mg, Q12H  PARoxetine, 40 mg, QAM  potassium chloride, 20 mEq, 2x/day-Food  remdesivir, 100 mg, Q24H  rOPINIRole, 0.5 mg, NIGHTLY  zinc sulfate, 50 mg, Daily      ALLERGIES:    No Known Allergies    PAST SURGICAL HISTORY:    Past Surgical History:   Procedure Laterality Date    CESAREAN SECTION      HX HYSTERECTOMY      PARTIAL GASTRECTOMY             SOCIAL HISTORY:    Social History     Socioeconomic History    Marital status: Married     Spouse name: Not on file    Number of children: Not on file    Years of education: Not on file    Highest education level: Not on file   Occupational History    Not on file    Tobacco Use    Smoking status: Former     Packs/day: 2.00     Years: 42.00     Additional pack years: 0.00     Total pack years: 84.00     Types: Cigarettes     Quit date: 2008     Years since quitting: 15.9    Smokeless tobacco: Never   Vaping Use    Vaping Use: Never used   Substance and Sexual Activity    Alcohol use: Not on file    Drug use: Never    Sexual activity: Not on file   Other Topics Concern    Not on file   Social History Narrative    Not on file     Social Determinants of Health     Financial Resource Strain: Not on file   Transportation Needs: Not on file   Social Connections: Medium Risk (03/17/2022)    Social Connections     SDOH Social Isolation: 3 to 5 times a week   Intimate Partner Violence: Not on file   Housing Stability: Not on file       FAMILY HISTORY:    Family Medical History:       Problem Relation (Age of Onset)    COPD Mother, Father              REVIEW OF SYSTEMS: the 12 point of systems were reviewed in the HPI      PHYSICAL EXAMINATION:    Vitals:   Blood pressure (!) 158/69, pulse 97, temperature 36.7 C (98.1 F), resp. rate 18, height 1.549 m ('5\' 1"'$ ), weight 48.1 kg (106 lb), SpO2 97%.  GEN:  Alert oriented female in no significant distress.  HEENT: NC/AT, pink conjunctivae, anicteric sclerae,     Nares patent, normal mucosa without drainage    Oral mmm, no thrush or oropharyngeal redness  NECK:  Supple, no JVD, thyromegaly, stridor, or masses; no palpable cervical or supraclavicular lymphadenopathy  CHEST: No deformities, unlabored respirations, equal chest expansion, no dullness to percussion  LUNGS: Scattered wheezes bilaterally.    HEART: Regular rhythm, normal rate, no murmur  ABD:  Non-distended, soft, no direct or rebound tenderness, no HSM  EXT:  No edema, cyanosis or clubbing  SKIN:  Warm and well pefused, no skin rashes  NEURO: AAO x 3; moves all extremities    DIAGNOSTIC STUDIES:  Labs  Recent Labs     03/15/22  1909 03/16/22  0537 03/17/22  0556 03/18/22  0314    WBC 1.4*  1.4 0.8*  0.8 2.1* 2.1*   HCT 30.4* 35.7 35.9 34.1   MCV 86.2 86.4 85.7 85.9   LYMPHOCYTES 19* 32  --   --       Recent Labs     03/15/22  1725 03/15/22  1909 03/15/22  1909 03/16/22  0537 03/17/22  0556 03/18/22  0314   POTASSIUM  --  3.4*   < > 4.0 3.0* 3.3*   CHLORIDE  --  111*   < > 112* 104 102   CO2  --  27   < > '27 31 29   '$ BUN  --  29*   < > '22 16 19   '$ GLUCOSE Negative  --   --   --   --   --    CALCIUM  --  8.2*   < > 8.5* 7.6* 7.6*   MAGNESIUM  --   --   --  1.7* 1.7* 1.7*   ALKPHOS  --  68  --   --  73 71   ALBUMIN  --  3.5  --   --  3.7 3.5    < > = values in this interval not displayed.              Zonia Kief. [facility] MD,FCCP,FACP,FAASM  Pulmonary Critical Care Sleep Medicine  [phone]

## 2022-03-18 NOTE — Care Management Notes (Signed)
[place] Management Initial Evaluation    Patient Name: Laura Oconnell  Date of Birth: 06-08-1950  Sex: female  Date/Time of Admission: 03/15/2022  5:11 PM  Room/Bed: 318/A  Payor: MEDICARE / Plan: MEDICARE PART A AND B / Product Type: Medicare /   Primary Care Providers:  Octaviano Batty, MD, MD (General)    Pharmacy Info:   [id number] - [address], [place] - [address] AT [facility] [address]    [address] [place] [address]    Phone: [phone] Fax: [phone]    Hours: Not open 24 hours          Emergency Contact Info:   Extended Emergency Contact Information  Primary Emergency Contact: [facility]  Address: [address]           [place], [place] [address] [place]  Mobile Phone: [phone]  Relation: Husband  Preferred language: English  Interpreter needed? No  Secondary Emergency Contact: Laura Oconnell,Laura Oconnell  Address: [place] [address] [place] Phone: [phone]  Relation: Daughter    History:   Laura Oconnell is a 71 y.o., female, admitted to [facility] on 03/15/22    Height/Weight: 154.9 cm (_0 ) / 48.1 kg (106 lb)     LOS: 3 days   Admitting Diagnosis: COVID-19 [U07.1]    Assessment:      03/18/22 1502   Assessment Details   Assessment Type Admission   Date of Care Management Update 03/18/22   Readmission   Is this a readmission? No   Insurance Information/Type   Insurance type Medicare   Employment/Financial   Financial Concerns none   Living Environment   Select an age group to open "lives with" row.  Adult   Lives With spouse   Living Arrangements house   Able to Return to Prior Arrangements yes   Home Safety   Home Assessment: Stairs in [place] Accessibility stairs to enter home;stairs within home   Home Safety Comments Patient states she does not have to climb any stairs once inside the home, but there are 7 to get into the house.   Care  Management Plan   Discharge Planning Status initial meeting   Discharge plan discussed with: Patient;Spouse   CM will evaluate for rehabilitation potential yes   Patient choice offered to patient/family yes   Discharge Needs Assessment   Equipment Currently Used at Home cane, straight;shower chair;walker, front wheeled;walker, rolling   Equipment Needed After Discharge none   Discharge Facility/Level of Care Needs Home (Patient/Family Member/other)(code 1)   Transportation Available family or friend will provide   Referral Information   Admission Type inpatient   Arrived From home or self-care   ADVANCE DIRECTIVES   Type of Advance Directive Completed   (Informed patient husband Laura Oconnell to please bring copy of advanced directives to be scanned into patients chart. Husband voiced understanding.)     Met with patient and patients Husband Laura Oconnell to discuss discharge needs. Patient lives in 2 story home with 7 steps to get into home but does not have to use stairs inside home. Patient states she feels safe in her home and has all necessary utilities. Patient and husband denies any discharge needs and refuse any HH or short term rehab at this time. Patient states she  has advanced directives and was asked by CM to please bring copy of documents to be scanned in for chart. Patient said doctor mentioned maybe needing O2 at home and wants to use [facility] since that is where they get all their other DME. Patients goals are to return home at discharge.     Discharge Plan:  Home (Patient/Family Member/other) (code 1)      The patient will continue to be evaluated for developing discharge needs.     Case Manager: Armando Reichert, RN  Phone: [phone]

## 2022-03-18 NOTE — PT Evaluation (Signed)
[facility]  [place], [address]  [phone]  [phone]  Rehabilitation Services  Physical Therapy Inpatient Initial Evaluation    Patient Name: Laura Oconnell  Date of Birth: [birth year]  Height: Height: 154.9 cm ('5\' 1"'$ )  Weight: Weight: 48.1 kg (106 lb)  Room/Bed: 318/A  Payor: MEDICARE / Plan: MEDICARE PART A AND B / Product Type: Medicare /       PMH:  Past Medical History:   Diagnosis Date    Bronchiectasis with (acute) exacerbation (CMS HCC) 08/03/2021    Chronic obstructive pulmonary disease with acute exacerbation (CMS HCC) 09/18/2015    Chronic pain disorder 05/29/2015    Chronic, continuous use of opioids 08/03/2021    Closed right hip fracture, initial encounter (CMS [place]) 02/01/2021    Compression fracture 05/29/2015    Formatting of this note might be different from the original. Per Dr. 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 female who is being seen in f/u for acute respiratory failure due to COVID, toxic encephalopathy. Pt participated in PT eval. Pt performed supine<>sit IND. STS IND. Amb x 20' with FWW and SBA. Pt on 2 L O2 at rest. MD present and approved for pt to  amb in room, short distance without O2. Pt's O2 decreased with amb x 20". Nursing informed that pt would benefit from O2 use at home.    Discharge Needs:    Equipment Recommendation:  FWW      The patient presents with mobility limitations due to impaired strength and impaired functional activity tolerance that significantly impair/prevent patient's ability to participate in mobility-related activities of daily living (MRADLs) including  ambulation and transfers in order to safely complete, toileting, bathing, food preparation, laundering/household tasks, safely entering/exiting the home, in reasonable time. This functional mobility deficit can be sufficiently resolved with the use of a  FWW and O2  in order to decrease the risk of falls, morbidity, and mortality in performance of these MRADLs.  Patient is able to safely use this assistive device.    Discharge Disposition: (P) home    JUSTIFICATION OF DISCHARGE RECOMMENDATION   Based on current diagnosis, functional performance prior to admission, and current functional performance, this patient requires continued PT services in (P) home in order to achieve significant functional improvements in these deficit areas: aerobic capacity, endurance   03/18/22 1400   Rehab Session   Document Type evaluation   General Information   Patient Profile Reviewed yes   Onset of Illness/Injury or Date of Surgery 03/15/22   Medical Lines PIV Line  Respiratory Status nasal cannula  (2 L)   Existing Precautions/Restrictions droplet isolation;hard of hearing;oxygen therapy device and L/min  (Covid. 2 L O2)   Mutuality/Individual Preferences   Patient-Specific Goals (Include Timeframe) To return to home.   Plan of Care Reviewed With patient   Living Environment   Lives With spouse   Living Arrangements house   Home Assessment: Stairs in [place] Accessibility stairs (2 railings present);stairs to enter home;stairs within home   Transportation Available family or friend will provide    Stairs Within Home, Primary   Stair Railings, Within Home, Primary railing on right side (ascending)   Number of Stairs, Within Home, Primary seven   Home Main Entrance   Stair Railings, Main Entrance railings on both sides of stairs   Number of Stairs, Main Entrance seven   Functional Level Prior   Ambulation 1 - assistive equipment   Transferring 0 - independent   Toileting 0 - independent   Bathing 0 - independent   Dressing 0 - independent   Eating 0 - independent   Communication 0 - understands/communicates without difficulty   Swallowing 0-->swallows foods/liquids without difficulty   Self-Care   Equipment Currently Used at Home yes   Equipment Currently Used at [employer], rolling;cane, straight  (Pt uses SPC in community.)   Pre Treatment Status   Pre Treatment Patient Status Patient supine in bed;Call light within reach;Nurse approved session;Patient safety alarm activated   Support Present Pre Treatment  Other (See comments)  (MD present)   Communication Pre Treatment  MD   Communication Pre Treatment Comment MD for pt to amb without O2 for  amb short distance in room.   Cognitive Assessment/Interventions   Behavior/Mood Observations behavior appropriate to situation, WNL/WFL   Orientation Status oriented x 4   Vital Signs   Pre-Treatment Heart Rate (beats/min) 118   Post-treatment Heart Rate (beats/min) 127   Pre SpO2 (%) 97   O2 Delivery Pre Treatment supplemental O2   Post SpO2 (%) 78  (After amb x 20'. 2 L O2 applied after amb with O2 sats resolving to 97%.)   O2 Delivery Post Treatment room air   Pain Assessment   Pain Intervention  PRN Medication   Pretreatment Pain Rating 7/10   Pain Location   (B Lumbar and B hips.)   RLE Assessment   RLE Assessment   (3+)   LLE Assessment   LLE Strength 3+   Bed Mobility Assessment/Treatment   Supine-Sit Independence independent   Sit to Supine, Independence independent   Transfer Assessment/Treatment   Sit-Stand Independence independent    Stand-Sit Independence independent   Sit-Stand-Sit, Assist Device walker, front wheeled   Gait Assessment/Treatment   Total Distance Ambulated 20   Independence  stand-by assistance   Assistive Device  walker, front wheeled   Distance in Feet 20  (Distance limited due to pt being on isolation.)   Post Treatment Status   Post Treatment Patient Status Patient supine in bed;Call light within reach;Patient safety alarm activated   Support Present Post Treatment  Nurse present   Plan of Care Review   Plan Of Care Reviewed With patient   Physical Therapy Clinical Impression   Assessment Laura Oconnell is a 71 y.o. female who is being seen in f/u for acute respiratory failure due to COVID, toxic encephalopathy. Pt participated in PT eval. Pt performed supine<>sit IND. STS IND. Amb x 20' with FWW and SBA. Pt on 2 L O2 at  rest. MD present and approved for pt to amb in room, short distance without O2. Pt's O2 decreased with amb x 20". Nursing informed that pt would benefit from O2 use at home.   Patient/Family Goals Statement To return to home.   Criteria for Skilled Therapeutic yes;meets criteria;skilled treatment is necessary   Functional Limitations in Following  self-care;home management   Rehab Potential good   Therapy Frequency   (1-2 x day for at least 1 day/week)   Predicted Duration of Therapy Intervention (days/wks) until discharge   Anticipated Discharge Disposition home   Evaluation Complexity Justification   Examination Components Strength;Bed mobility;Transfers;Ambulation   Presentation Stable: Uncomplicated, straight-forward, problem focused   Clinical Decision Making Low complexity   Evaluation Complexity Low complexity   Care Plan Goals   PT Rehab Goals Gait Training Goal;Strength Goal   Gait Training  Goal, Distance to Achieve   Gait Training  Goal, Date Established 03/18/22   Gait Training  Goal, Time to Achieve by discharge   Gait Training  Goal, Independence Level modified independence   Gait Training   Goal, Assist Device walker, rolling   Gait Training  Goal, Distance to Achieve 50'   Strength Goal   Strength Goal, Date Established 03/18/22   Strength Goal, Time to Achieve by discharge   Strength Goal, Measure to Achieve B LE 4 throughout   Planned Therapy Interventions, PT Eval   Planned Therapy Interventions (PT) gait training;strengthening    .        Plan:   Current Intervention: (P) gait training, strengthening  To provide physical therapy services (P)  (1-2 x day for at least 1 day/week)  for duration of (P) until discharge.    The risks/benefits of therapy have been discussed with the patient/caregiver and he/she is in agreement with the established plan of care.       Subjective & Objective     Past Medical History:   Diagnosis Date    Bronchiectasis with (acute) exacerbation (CMS HCC) 08/03/2021    Chronic obstructive pulmonary disease with acute exacerbation (CMS HCC) 09/18/2015    Chronic pain disorder 05/29/2015    Chronic, continuous use of opioids 08/03/2021    Closed right hip fracture, initial encounter (CMS [employer]) 02/01/2021    Compression fracture 05/29/2015    Formatting of this note might be different from the original. Per Dr. 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 CLINICAL PRESENTATION IS STABLE AND UNCOMPLICATED    Therapist:     Hanley Hays, PT  03/18/2022, 15:44

## 2022-03-19 LAB — CBC
HCT: 33.2 % (ref 31.2–41.9)
HGB: 11.4 g/dL (ref 10.9–14.3)
MCH: 29.5 pg (ref 24.7–32.8)
MCHC: 34.4 g/dL (ref 32.3–35.6)
MCV: 85.7 fL (ref 75.5–95.3)
MPV: 6.5 fL — ABNORMAL LOW (ref 7.9–10.8)
PLATELETS: 130 10*3/uL — ABNORMAL LOW (ref 140–440)
RBC: 3.87 10*6/uL (ref 3.63–4.92)
RDW: 15 % (ref 12.3–17.7)
WBC: 2.8 10*3/uL — ABNORMAL LOW (ref 3.8–11.8)

## 2022-03-19 LAB — COMPREHENSIVE METABOLIC PANEL, NON-FASTING
ALBUMIN/GLOBULIN RATIO: 1.2 (ref 0.8–1.4)
ALBUMIN: 3.5 g/dL (ref 3.5–5.7)
ALKALINE PHOSPHATASE: 69 U/L (ref 34–104)
ALT (SGPT): 12 U/L (ref 7–52)
ANION GAP: 7 mmol/L (ref 4–13)
AST (SGOT): 13 U/L (ref 13–39)
BILIRUBIN TOTAL: 0.2 mg/dL — ABNORMAL LOW (ref 0.3–1.2)
BUN/CREA RATIO: 27 — ABNORMAL HIGH (ref 6–22)
BUN: 20 mg/dL (ref 7–25)
CALCIUM, CORRECTED: 8.4 mg/dL — ABNORMAL LOW (ref 8.9–10.8)
CALCIUM: 8 mg/dL — ABNORMAL LOW (ref 8.6–10.3)
CHLORIDE: 103 mmol/L (ref 98–107)
CO2 TOTAL: 30 mmol/L (ref 21–31)
CREATININE: 0.73 mg/dL (ref 0.60–1.30)
ESTIMATED GFR: 87 mL/min/{1.73_m2} (ref >59)
GLOBULIN: 3 (ref 2.9–5.4)
GLUCOSE: 97 mg/dL (ref 74–109)
OSMOLALITY, CALCULATED: 282 mOsm/kg (ref 270–290)
POTASSIUM: 3.6 mmol/L (ref 3.5–5.1)
PROTEIN TOTAL: 6.5 g/dL (ref 6.4–8.9)
SODIUM: 140 mmol/L (ref 136–145)

## 2022-03-19 LAB — MAGNESIUM: MAGNESIUM: 1.9 mg/dL (ref 1.9–2.7)

## 2022-03-19 LAB — POC BLOOD GLUCOSE (RESULTS)
GLUCOSE, POC: 106 mg/dl (ref 50–500)
GLUCOSE, POC: 107 mg/dl (ref 50–500)
GLUCOSE, POC: 151 mg/dl (ref 50–500)

## 2022-03-19 MED ORDER — BUDESONIDE-FORMOTEROL HFA 80 MCG-4.5 MCG/ACTUATION AEROSOL INHALER
2.0000 | INHALATION_SPRAY | Freq: Two times a day (BID) | RESPIRATORY_TRACT | 0 refills | Status: DC
Start: 2022-03-19 — End: 2023-09-12

## 2022-03-19 MED ORDER — DOXYCYCLINE HYCLATE 100 MG TABLET
100.0000 mg | ORAL_TABLET | Freq: Two times a day (BID) | ORAL | 0 refills | Status: AC
Start: 2022-03-19 — End: 2022-03-26

## 2022-03-19 MED ORDER — DEXAMETHASONE 6 MG TABLET
6.0000 mg | ORAL_TABLET | Freq: Every day | ORAL | 0 refills | Status: AC
Start: 2022-03-19 — End: 2022-03-24

## 2022-03-19 MED ORDER — LACTOBACILLUS RHAMNOSUS GG 10 BILLION CELL CAPSULE
1.0000 | ORAL_CAPSULE | Freq: Two times a day (BID) | ORAL | 0 refills | Status: AC
Start: 2022-03-19 — End: 2022-04-02

## 2022-03-19 NOTE — Discharge Summary (Addendum)
[place]     DISCHARGE SUMMARY      PATIENT NAME:  Laura Oconnell   MRN:  [id number]  DOB:  07/12/1950    INPATIENT ADMISSION DATE: 03/15/2022   DATE OF DISCHARGE:  03/19/22     ATTENDING PHYSICIAN: Dr Hinton Lovely    PRIMARY CARE PHYSICIAN: Octaviano Batty, MD     HOSPITAL PRESENTATION:    Please see full admission H&P for details.      FURTHER HOSPITAL COURSE WITH DISCHARGE DIAGNOSES:    Laura Oconnell is a  71 year old white female with known history of COPD, who does not use any oxygen at home, presents to the ED with "shaking" for  2-3 days.  She did not report any shortness of breath, but has had some nonproductive cough.  Apparently she was altered upon arrival to the ED via EMS, and was given Narcan for questionable unintentional overdose.  Patient is on multiple pain medications for chronic pain disorder.  She did respond to Narcan, however became somewhat confused.  She was protecting her airway, did not require intubation.  Her mentation did improve, and she was able to give [place] her history following.  She denied that she has taken too much medication, or had tried to hurt herself.  She was hypoxic, requiring 2 L oxygen, she did test positive for COVID-19, and chest x-ray did show basilar infiltrates.  Her white blood count was low at 1.4, hemoglobin 10.3, hematocrit 30.4, platelets of 94.  Marland Kitchen  Her sodium was 146, potassium 3.4, chloride of 111, bicarb 27, BUN 29, creatinine 0.94.  Lactic was normal at 0.5.  Ethanol was negative, drug screen was positive for opiates, oxycodone which he was prescribed.  Patient was wanting to go home, but as she came in altered, requiring Narcan, with confirmed COVID-19, requiring oxygen at this time, it is felt she would benefit from admission the floor for continued management of respiratory symptoms with remdesivir, antibiotics, steroids, and albuterol.   CTA performed which was negative for PE but did show bilateral infiltrative  opacities most consistent with viral or atypical PNA.  Patient's symptoms continued to improve.  She was unable to be weaned off O2, however, patient states that her oxygen saturation always ran in the high 80's at home.  Dr. Sharlette Dense was consulted, recommended triple therapy with Symbicort and Spiriva for which she has in the room with her.  She can be discharged home today after her last dose of remdesivir. Home O2 1-2 L continuous has been arranged. She will need to f/u with Dr Sharlette Dense and her PCP within 2 weeks.           Problem List:  Active Hospital Problems   (*Primary Problem)    Diagnosis    *Acute respiratory failure due to COVID-19 (CMS HCC)    COPD (chronic obstructive pulmonary disease) (CMS HCC)    Chronic pain disorder       PHYSICAL EXAM   DAY OF DISCHARGE:    BP (!) 133/59   Pulse 95   Temp 37.2 C (98.9 F)   Resp 18   Ht 1.549 m (_0 )   Wt 48.1 kg (106 lb)   SpO2 97%   BMI 20.03 kg/m     General:  Patient is resting in bed, no acute distress, alert and oriented x3   Eyes:  PERRL, no scleral icterus   HENT:  Normocephalic, atraumatic, oral mucosa is moist and pink, no  nasal discharge   Heart:  RRR,  Lungs:  Unlabored respirations.  Lungs are diminished no wheezes, no rales  Abdomen:  Soft, active bowel sounds, non-tender to palpation, non-distended  Extremities:   No edema in lower extremities bilaterally   Skin:  Warm and dry.  Not diaphoretic  Neuro:  A&O x 3.  No focal deficits.  Speech intact.  Not tremulous  Psych:  Cooperative, not agitated    LABS:  CBC with Diff (Last 48 Hours):    Recent Results in last 48 hours     03/18/22  0314 03/19/22  0345   WBC 2.1* 2.8*   HGB 11.8 11.4   HCT 34.1 33.2   MCV 85.9 85.7   PLTCNT 126* 130*          Last BMP  (Last result in the past 48 hours)        Na   K   Cl   CO2   BUN   Cr   Calcium   Glucose   Glucose-Fasting        03/19/22 0345 140   3.6   103   30   20   0.73   8.0   97               Last Hepatic Panel  (Last result in the past 48  hours)        Albumin   Total PTN   Total Bili   Direct Bili   Ast/SGOT   Alt/SGPT   Alk Phos        03/19/22 0345 3.5   6.5   0._0 69                BMP (Last 48 Hours):    Recent Results in last 48 hours     03/18/22  0314 03/19/22  0345   SODIUM 139 140   POTASSIUM 3.3* 3.6   CHLORIDE 102 103   CO2 29 30   BUN 19 20   CREATININE 0.84 0.73   CALCIUM 7.6* 8.0*   GLUCOSENF 116* 97          DISCHARGE MEDICATIONS:     Current Discharge Medication List        START taking these medications.        Details   budesonide-formoteroL 80-4.5 mcg/actuation oral inhaler  Commonly known as: SYMBICORT   2 Puffs, Inhalation, 2 TIMES DAILY  Qty: 1 Each  Refills: 0     dexAMETHasone 6 mg Tablet  Commonly known as: DECADRON   6 mg, Oral, DAILY  Qty: 5 Tablet  Refills: 0     doxycycline 100 mg Tablet   100 mg, Oral, 2 TIMES DAILY  Qty: 14 Tablet  Refills: 0     lactobacillus rhamnosus (GG) 10 B CELL/cap Capsule  Commonly known as: Culturelle   1 Capsule, Oral, 2 TIMES DAILY WITH FOOD  Qty: 28 Capsule  Refills: 0            CONTINUE these medications - NO CHANGES were made during your visit.        Details   baclofen 10 mg Tablet  Commonly known as: LIORESAL   10 mg, Oral, 3 TIMES DAILY PRN  Refills: 0     Calcium-Cholecalciferol (D3) 600 mg-5 mcg (200 unit) Capsule   1 Tablet, Oral, DAILY  Refills: 0     COPPER ORAL  2 mg, Oral, DAILY  Refills: 0     Diltiazem HCl 120 mg Capsule,Sustained Action 24 hr  Commonly known as: TIAZAC   1 Capsule, Oral, DAILY  Refills: 0     ferrous sulfate 324 mg (65 mg iron) Tablet, Delayed Release (E.C.)  Commonly known as: FERATAB   324 mg, Oral, DAILY  Refills: 0     furosemide 20 mg Tablet  Commonly known as: LASIX   20 mg, Oral, DAILY  Qty: 30 Tablet  Refills: 1     gabapentin 800 mg Tablet  Commonly known as: NEURONTIN   1 Tablet, Oral, 3 TIMES DAILY  Refills: 0     HYDROcodone-acetaminophen 5-325 mg Tablet  Commonly known as: NORCO   1 Tablet, Oral, EVERY 6 HOURS PRN  Refills: 0      memantine 10 mg Tablet  Commonly known as: NAMENDA   10 mg, 2 TIMES DAILY  Refills: 0     omeprazole 20 mg Capsule, Delayed Release(E.C.)  Commonly known as: PRILOSEC   20 mg, Oral, DAILY  Refills: 0     oxyCODONE 10 mg tablet,oral only,ext.rel.12 hr  Commonly known as: oxyCONTIN   10 mg, Oral, EVERY 12 HOURS  Refills: 0     PARoxetine 40 mg Tablet  Commonly known as: PAXIL   1 Tablet, Oral, EVERY MORNING  Qty: 90 Tablet  Refills: 3     rOPINIRole 0.5 mg Tablet  Commonly known as: REQUIP   0.5 mg, Oral, NIGHTLY PRN  Refills: 0     tiotropium bromide 18 mcg Capsule, w/Inhalation Device  Commonly known as: SPIRIVA HANDIHALER   18 mcg, Inhalation, DAILY  Refills: 0                 DISCHARGE DISPOSITION:  home with home O2    DISCHARGE INSTRUCTIONS: Patient is to f/u with Dr Sharlette Dense and her PCP 1-2 weeks    Follow-up Information       Octaviano Batty, MD Follow up in 1 week(s).    Specialty: GENERAL PRACTICE  Why: [facility] will call you with follow up appointments  Contact information:  [address]  [place] [address]  [phone]               Wilma Flavin, MD Follow up in 2 day(s).    Specialties: PULMONARY DISEASE, SLEEP MEDICINE  Why: [facility] will call you with follow up appointments  Contact information:  [place] [address]  [phone]                              DME - HOME OXYGEN    Study used to evaluate oxygen saturation at rest vs exercise was performed during an inpatient hospital stay within 2 days prior to discharge date and was the last test performed prior to discharge and met the following criteria:   Study performed at rest without oxygen.  Study performed during exercise without oxygen.  Study performed during exercise with oxygen applied that demonstrates improvement.    Results of oxygen trials:                              Ht 154.9 cm    Wt 48.08 kg    Current Attending: Corie Chiquito    I certify this pt is under my care as an attending physician & that I, or  a  collaborating ANP/PA had a face to face encounter with the pt or the durable medical power of attorney, as appropriate and explained the need for DME on the following date: 03/19/2022    The primary diagnosis as discussed in the face to face encounter justifying the need for home health services/DME is as follows: Respiratory failure    New Patient or Renewal? New Patient    Liter flow per minute 2    RA O2 Sats at rest: 85    RA O2 Sats at Rest Date: 03/19/2022    RA O2 Sats at Rest Time: 43:56 PM    I certify that home oxygen is necessary due to the following condition At rest, (awake but sitting or lying down) O2 sat is at or below 88%    Physician has determined that patient has a severe lung disease or hypoxia related symptoms that will improve with oxygen Yes    Start Date 03/19/2022    Type of Concentrator STATIONARY CONCENTRATOR    Type of Concentrator PORTABLE OXYGEN CONCENTRATOR    Type of Concentrator OXYGEN CONTENTS    Type of Concentrator OXYGEN CONSERVING DEVICE    Type of Concentrator PORTABLE OXYGEN TANKS    Freedom of Choice: I have informed patient of their freedom of choice with respect to DME providers    Oxygen use will be continuous? Yes    Patient is mobile within the home and will require portable oxygen: Yes    Delivery Device NASAL CANNULA             Isolation Status:  Enhanced Droplet    Copies sent to Care Team         Relationship Specialty Notifications Start End    Octaviano Batty, MD PCP - General GENERAL PRACTICE  07/06/21     Phone: [phone] Fax: [phone]         [address] [place] [address]            Melissa D Lilly, [place] HOSPITALIST        The Hospitalist personally evaluated and examined the patient in conjunction with the MLP and agree with the assessments, treatment plan and disposition of the patient as recorded by the [facility].      Discharge and follow-up plan discussed with patient who verbalized understanding and agreed.  Time spent, 38  minutes.    [place], [place] HOSPITALIST

## 2022-03-19 NOTE — Nurses Notes (Signed)
Pt's oxygen decreased to 85% on room air. on pt. Pt's oxygen saturation improved to 92% with 2L oxygen NC replaced on patient.

## 2022-03-19 NOTE — Care Management Notes (Signed)
Received CM consult for home 02. Referral info submitted through [facility] for [facility].

## 2022-03-19 NOTE — Nurses Notes (Signed)
[facility] called for 02 therapy. Info faxed at this time. Fax confirmed.

## 2022-03-19 NOTE — Nurses Notes (Signed)
This nurse has called [facility] multiple times since 12 pm, patient is needing home 02 setup. [facility] not arrived with 02 set up. Myself and case management have worked on setup. Patient reports needing to leave. Patient reports Wanting to go without therapy. Patient advised not to leave without 02 therapy. Unable to give ETA on oxygen arrival time. Case management called to re-fax info.

## 2022-03-19 NOTE — Nurses Notes (Signed)
Danielle from [facility] called for home 02 set-up. Reports she will contact driver and expedite delivery process.

## 2022-03-19 NOTE — Nurses Notes (Signed)
Patient discharged home with family.  AVS reviewed with patient.  A written copy of the AVS and discharge instructions was given to the patient.  Questions sufficiently answered as needed.  Patient encouraged to follow up with PCP as indicated.  In the event of an emergency, patient instructed to call 911 or go to the nearest emergency room. Pt. Left department via wheelchair at this time with portable home oxygen concentrator and all belongings.

## 2022-03-19 NOTE — Nurses Notes (Signed)
Home 02 arrived at bedside.

## 2022-03-20 LAB — ADULT ROUTINE BLOOD CULTURE, SET OF 2 BOTTLES (BACTERIA AND YEAST): BLOOD CULTURE, ROUTINE: ABNORMAL — CR

## 2022-03-23 ENCOUNTER — Telehealth (HOSPITAL_COMMUNITY): Payer: Self-pay

## 2022-09-20 NOTE — Care Management Notes (Signed)
Referral Information  ++++++ Placed Provider #1 ++++++  Case Manager: Vicki Johnson  Provider Type: DME  Provider Name: [[facility]] [facility] [place]  Address:  [address]  [place], [place] [address]  Contact: [place] [facility]    Phone: [phone] x  Fax:   Fax: [phone]

## 2023-04-04 ENCOUNTER — Ambulatory Visit (INDEPENDENT_AMBULATORY_CARE_PROVIDER_SITE_OTHER): Payer: Self-pay | Admitting: OTOLARYNGOLOGY

## 2023-04-17 ENCOUNTER — Encounter (INDEPENDENT_AMBULATORY_CARE_PROVIDER_SITE_OTHER): Payer: Self-pay | Admitting: OTOLARYNGOLOGY

## 2023-04-17 ENCOUNTER — Ambulatory Visit: Payer: Medicare Other | Attending: OTOLARYNGOLOGY | Admitting: OTOLARYNGOLOGY

## 2023-04-17 ENCOUNTER — Other Ambulatory Visit: Payer: Self-pay

## 2023-04-17 VITALS — Ht 61.0 in | Wt 106.0 lb

## 2023-04-17 DIAGNOSIS — Z85828 Personal history of other malignant neoplasm of skin: Secondary | ICD-10-CM

## 2023-04-17 DIAGNOSIS — D0439 Carcinoma in situ of skin of other parts of face: Secondary | ICD-10-CM | POA: Insufficient documentation

## 2023-04-17 DIAGNOSIS — C44621 Squamous cell carcinoma of skin of unspecified upper limb, including shoulder: Secondary | ICD-10-CM | POA: Insufficient documentation

## 2023-04-17 DIAGNOSIS — H6123 Impacted cerumen, bilateral: Secondary | ICD-10-CM | POA: Insufficient documentation

## 2023-04-17 DIAGNOSIS — L989 Disorder of the skin and subcutaneous tissue, unspecified: Secondary | ICD-10-CM | POA: Insufficient documentation

## 2023-04-17 NOTE — Procedures (Signed)
ENT, [facility]  [address]  [place] [address]  Operated by [facility]  Procedure Note    Name: Laura Oconnell MRN:  [id number]   Date: 04/17/2023 DOB:  06-02-1950 (73 y.o.)         [phone] - REMOVAL IMPACTED CERUMEN W/ INSTRUMENT, UNILATERAL (AMB ONLY-PD)    Performed by: Conchita Paris, DO  Authorized by: Conchita Paris, DO    Time Out:     Immediately before the procedure, a time out was called:  Yes    Patient verified:  Yes    Procedure Verified:  Yes    Site Verified:  Yes  Documentation:      Procedure: Cerumen cleaning  Pre-op Dx: Cerumen impaction      Bilateral EAC(s) examined under binocular microscopy.  Cerumen and/or debris was cleaned from the canal(s) using curettes, suction, and alligator forceps.  Patient tolerated procedure well.     11312 - SHAVE EPI/DERMAL LESION 1.0-2.0CM,FACE,EARS,ETC (AMB ONLY)    Performed by: Conchita Paris, DO  Authorized by: Conchita Paris, DO    Time Out:     Immediately before the procedure, a time out was called:  Yes    Patient verified:  Yes    Procedure Verified:  Yes    Site Verified:  Yes  Documentation:      Shave biopsy performed on right facial lesion 1.4 cm in size flexible razor under local using 1 % lidocaine with epi.  Hemostasis achieved with Drysol.  Patient tolerated procedure without complication.  Specimen was sent to pathology         Conchita Paris, DO

## 2023-04-17 NOTE — H&P (Signed)
[facility], [facility]  [address]  [place] [address]  Operated by [facility]  Return Patient Visit    Name: Laura Oconnell MRN:  [id number]   Date: 04/17/2023 DOB: Nov 14, 1950 (73 y.o.)       Referring Provider:  No ref. provider found    Reason for Visit:   Chief Complaint   Patient presents with    Follow Up 3 Months     3 month rc on skin.        History of Present Illness:  Laura Oconnell is a 73 y.o. female who is FU on h/o skin cancer, last seen 12/2021. She c/o a raised lesion along the R cheek for several months. It will bleed when scratched. She also has a left forehead scaly lesion. Both ears are clogged and she uses Qtips.       Patient History:  Patient Active Problem List   Diagnosis    Bronchiectasis with (acute) exacerbation (CMS HCC)    COPD (chronic obstructive pulmonary disease) (CMS HCC)    Chronic pain disorder    Chronic, continuous use of opioids    Closed right hip fracture, initial encounter (CMS HCC)    Compression fracture    Depression    Difficulty with speech    Essential hypertension    GAD (generalized anxiety disorder)    Gastroesophageal reflux disease without esophagitis    Hypocalcemia    Hypoglycemia    Hyponatremia    Intracranial aneurysm    Myofascial pain syndrome    Neuropathic pain    Opioid use agreement exists    Osteoporosis    Other specified alveolar and parietoalveolar pneumonopathies    Spasm of back muscles    Spondylosis of thoracic region without myelopathy or radiculopathy    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    Acute postoperative anemia due to expected blood loss    Blood loss anemia    Encounter for rehabilitation    Restless leg syndrome    Acute respiratory failure due to COVID-19 (CMS HCC)     Current Outpatient Medications   Medication Sig    baclofen (LIORESAL) 10 mg Oral Tablet Take 1 Tablet (10 mg total) by mouth Three times a day as needed    budesonide-formoteroL (SYMBICORT) 80-4.5 mcg/actuation  Inhalation oral inhaler Take 2 Puffs by inhalation Twice daily for 30 days    Calcium-Cholecalciferol, D3, 600 mg-5 mcg (200 unit) Oral Capsule Take 1 Capsule by mouth Once a day    COPPER ORAL Take 2 mg by mouth Once a day    Diltiazem HCl (TIAZAC) 120 mg Oral Capsule,Sustained Action 24 hr Take 1 Capsule (120 mg total) by mouth Once a day    ferrous sulfate (FERATAB) 324 mg (65 mg iron) Oral Tablet, Delayed Release (E.C.) Take 1 Tablet (324 mg total) by mouth Once a day    furosemide (LASIX) 20 mg Oral Tablet Take 1 Tablet (20 mg total) by mouth Once a day    gabapentin (NEURONTIN) 800 mg Oral Tablet Take 1 Tablet (800 mg total) by mouth Three times a day    HYDROcodone-acetaminophen (NORCO) 5-325 mg Oral Tablet Take 1 Tablet by mouth Every 6 hours as needed    memantine (NAMENDA) 10 mg Oral Tablet 1 Tablet (10 mg total) Twice daily    omeprazole (PRILOSEC) 20 mg Oral Capsule, Delayed Release(E.C.) Take 1 Capsule (20 mg total) by mouth Once a day  oxyCODONE (OXYCONTIN) 10 mg Oral tablet,oral only,ext.rel.12 hr Take 1 Tablet (10 mg total) by mouth Every 12 hours    PARoxetine (PAXIL) 40 mg Oral Tablet Take 1 Tablet (40 mg total) by mouth Every morning    rOPINIRole (REQUIP) 0.5 mg Oral Tablet Take 1 Tablet (0.5 mg total) by mouth Every night as needed    tiotropium bromide (SPIRIVA HANDIHALER) 18 mcg Inhalation Capsule, w/Inhalation Device Take 1 Capsule (18 mcg total) by inhalation Once a day      No Known Allergies  Past Medical History:   Diagnosis Date    Bronchiectasis with (acute) exacerbation (CMS HCC) 08/03/2021    Chronic obstructive pulmonary disease with acute exacerbation (CMS HCC) 09/18/2015    Chronic pain disorder 05/29/2015    Chronic, continuous use of opioids 08/03/2021    Closed right hip fracture, initial encounter (CMS HCC) 02/01/2021    Compression fracture 05/29/2015    Formatting of this note might be different from the original. Per Dr. Georgianne Fick, Neurosurgery; kyphoplasty x 2    Depression  05/29/2015    Difficulty with speech 08/03/2021    Esophageal reflux     Essential hypertension 05/29/2015    GAD (generalized anxiety disorder) 05/29/2015    Gastroesophageal reflux disease without esophagitis 05/29/2015    Hypocalcemia 05/29/2015    Hypoglycemia 11/25/2016    Hyponatremia 08/03/2021    Intracranial aneurysm 08/03/2021    Myofascial pain syndrome 06/29/2018    Neuropathic pain 06/29/2018    Opioid use agreement exists 06/29/2018    Osteoporosis 05/29/2015    Other specified alveolar and parietoalveolar pneumonopathies 07/05/2005    Formatting of this note might be different from the original.    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     Past Surgical History:   Procedure Laterality Date    CESAREAN SECTION      HX HYSTERECTOMY      PARTIAL GASTRECTOMY       Family Medical History:       Problem Relation (Age of Onset)    COPD Mother, Father            Social History     Tobacco Use    Smoking status: Former     Current packs/day: 0.00     Average packs/day: 2.0 packs/day for 42.0 years (84.0 ttl pk-yrs)     Types: Cigarettes     Start date: 1966     Quit date: 2008     Years since quitting: 17.0    Smokeless tobacco: Never   Vaping Use    Vaping status: Never Used   Substance Use Topics    Drug use: Never       Review of Systems:  Review of Systems    Physical Exam:  Ht 1.549 m (5\' 1" )   Wt 48.1 kg (106 lb)   BMI 20.03 kg/m       ENT Physical Exam  Constitutional  Appearance: patient appears well-developed, well-nourished and well-groomed,  Communication/Voice: communication appropriate for developmental age; vocal quality normal;  Head and Face  Appearance: head appears normal, face appears normal and face appears atraumatic;  Palpation: facial palpation normal;  Salivary: glands normal;  Ear  Hearing: intact;  Auricles: right auricle normal; left auricle normal;  External Mastoids: right external mastoid normal; left external mastoid  normal;  Ear Canals: bilateral ear canals impacted cerumen observed;  Tympanic Membranes: right tympanic membrane normal; left tympanic  membrane normal;  Nose  External Nose: nares patent bilaterally; external nose normal;  Internal Nose: nasal mucosa normal; septum normal; bilateral inferior turbinates normal;  Oral Cavity/Oropharynx  Lips: normal;  Teeth: normal;  Gums: gingiva normal;  Tongue: normal;  Oral mucosa: normal;  Hard palate: normal;  Neck  Neck: neck normal; neck palpation normal;  Thyroid: thyroid normal;  Respiratory  Inspection: breathing unlabored; normal breathing rate;  Lymphatic  Palpation: lymph nodes normal;  Neurovestibular  Mental Status: alert and oriented;  Psychiatric: mood normal; affect is appropriate;  Cranial Nerves: cranial nerves intact;       Assessment:  ENCOUNTER DIAGNOSES     ICD-10-CM   1. 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 Arm skin lesion, right  L98.9   3. 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 Bilateral impacted cerumen  H61.23   5. Facial lesion  L98.9       Plan:  Medical records reviewed on 04/17/2023.  AU cerumen debrided.   Shave biopsy performed    Orders Placed This Encounter    [phone] - REMOVAL IMPACTED CERUMEN W/ INSTRUMENT, UNILATERAL (AMB ONLY-PD)    11312 - SHAVE EPI/DERMAL LESION 1.0-2.0CM,FACE,EARS,ETC (AMB ONLY)     Return in about 2 weeks (around 05/01/2023).    Marcelline Deist, PA-C  The advanced practice clinician's documentation was reviewed/amended in its entirety with the assessment and plan portion completely performed independently by me during this separate encounter.

## 2023-05-01 ENCOUNTER — Encounter (INDEPENDENT_AMBULATORY_CARE_PROVIDER_SITE_OTHER): Payer: Self-pay | Admitting: OTOLARYNGOLOGY

## 2023-05-01 ENCOUNTER — Other Ambulatory Visit (INDEPENDENT_AMBULATORY_CARE_PROVIDER_SITE_OTHER): Payer: Self-pay | Admitting: OTOLARYNGOLOGY

## 2023-05-01 ENCOUNTER — Ambulatory Visit: Payer: Medicare Other | Attending: OTOLARYNGOLOGY | Admitting: OTOLARYNGOLOGY

## 2023-05-01 ENCOUNTER — Other Ambulatory Visit: Payer: Self-pay

## 2023-05-01 ENCOUNTER — Ambulatory Visit (INDEPENDENT_AMBULATORY_CARE_PROVIDER_SITE_OTHER): Payer: Self-pay | Admitting: OTOLARYNGOLOGY

## 2023-05-01 VITALS — Ht 61.0 in | Wt 106.0 lb

## 2023-05-01 DIAGNOSIS — L989 Disorder of the skin and subcutaneous tissue, unspecified: Secondary | ICD-10-CM | POA: Insufficient documentation

## 2023-05-01 DIAGNOSIS — D0439 Carcinoma in situ of skin of other parts of face: Secondary | ICD-10-CM

## 2023-05-01 DIAGNOSIS — C44621 Squamous cell carcinoma of skin of unspecified upper limb, including shoulder: Secondary | ICD-10-CM | POA: Insufficient documentation

## 2023-05-01 NOTE — H&P (Signed)
[facility], [facility]  [address]  [place] [address]  Operated by [facility]  Return Patient Visit    Name: Laura Oconnell MRN:  [id number]   Date: 05/01/2023 DOB: [birth year] (72 y.o.)       Referring Provider:  No ref. provider found    Reason for Visit:   Chief Complaint   Patient presents with    Follow-up After Testing     Rc after biopsy        History of Present Illness:  Laura Oconnell is a 73 y.o. female who is FU on h/o skin cancer     Media Information    Document Information    Other: OUTSIDE PATHOLOGY   dermatopathology right cheek   04/24/2023 09:25   Attached To:   Laura Oconnell   Source Information    Laura Collum, LPN  Otolaryngology-Prn-Pc-   Document History        Patient History:  Patient Active Problem List   Diagnosis    Bronchiectasis with (acute) exacerbation (CMS HCC)    COPD (chronic obstructive pulmonary disease) (CMS HCC)    Chronic pain disorder    Chronic, continuous use of opioids    Closed right hip fracture, initial encounter (CMS HCC)    Compression fracture    Depression    Difficulty with speech    Essential hypertension    GAD (generalized anxiety disorder)    Gastroesophageal reflux disease without esophagitis    Hypocalcemia    Hypoglycemia    Hyponatremia    Intracranial aneurysm    Myofascial pain syndrome    Neuropathic pain    Opioid use agreement exists    Osteoporosis    Other specified alveolar and parietoalveolar pneumonopathies    Spasm of back muscles    Spondylosis of thoracic region without myelopathy or radiculopathy    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    Acute postoperative anemia due to expected blood loss    Blood loss anemia    Encounter for rehabilitation    Restless leg syndrome    Acute respiratory failure due to COVID-19 (CMS HCC)     Current Outpatient Medications   Medication Sig    baclofen (LIORESAL) 10 mg Oral Tablet Take 1 Tablet (10 mg total) by mouth Three times a day as needed     budesonide-formoteroL (SYMBICORT) 80-4.5 mcg/actuation Inhalation oral inhaler Take 2 Puffs by inhalation Twice daily for 30 days    Calcium-Cholecalciferol, D3, 600 mg-5 mcg (200 unit) Oral Capsule Take 1 Capsule by mouth Once a day    COPPER ORAL Take 2 mg by mouth Once a day    Diltiazem HCl (TIAZAC) 120 mg Oral Capsule,Sustained Action 24 hr Take 1 Capsule (120 mg total) by mouth Once a day    ferrous sulfate (FERATAB) 324 mg (65 mg iron) Oral Tablet, Delayed Release (E.C.) Take 1 Tablet (324 mg total) by mouth Once a day    furosemide (LASIX) 20 mg Oral Tablet Take 1 Tablet (20 mg total) by mouth Once a day    gabapentin (NEURONTIN) 800 mg Oral Tablet Take 1 Tablet (800 mg total) by mouth Three times a day    HYDROcodone-acetaminophen (NORCO) 5-325 mg Oral Tablet Take 1 Tablet by mouth Every 6 hours as needed    memantine (NAMENDA) 10 mg Oral Tablet 1 Tablet (10 mg total) Twice daily    omeprazole (PRILOSEC) 20 mg Oral Capsule, Delayed Release(E.C.) Take  1 Capsule (20 mg total) by mouth Once a day    oxyCODONE (OXYCONTIN) 10 mg Oral tablet,oral only,ext.rel.12 hr Take 1 Tablet (10 mg total) by mouth Every 12 hours    PARoxetine (PAXIL) 40 mg Oral Tablet Take 1 Tablet (40 mg total) by mouth Every morning    rOPINIRole (REQUIP) 0.5 mg Oral Tablet Take 1 Tablet (0.5 mg total) by mouth Every night as needed    tiotropium bromide (SPIRIVA HANDIHALER) 18 mcg Inhalation Capsule, w/Inhalation Device Take 1 Capsule (18 mcg total) by inhalation Once a day      No Known Allergies  Past Medical History:   Diagnosis Date    Bronchiectasis with (acute) exacerbation (CMS HCC) 08/03/2021    Chronic obstructive pulmonary disease with acute exacerbation (CMS HCC) 09/18/2015    Chronic pain disorder 05/29/2015    Chronic, continuous use of opioids 08/03/2021    Closed right hip fracture, initial encounter (CMS HCC) 02/01/2021    Compression fracture 05/29/2015    Formatting of this note might be different from the original. Per Dr.  Georgianne Fick, Neurosurgery; kyphoplasty x 2    Depression 05/29/2015    Difficulty with speech 08/03/2021    Esophageal reflux     Essential hypertension 05/29/2015    GAD (generalized anxiety disorder) 05/29/2015    Gastroesophageal reflux disease without esophagitis 05/29/2015    Hypocalcemia 05/29/2015    Hypoglycemia 11/25/2016    Hyponatremia 08/03/2021    Intracranial aneurysm 08/03/2021    Myofascial pain syndrome 06/29/2018    Neuropathic pain 06/29/2018    Opioid use agreement exists 06/29/2018    Osteoporosis 05/29/2015    Other specified alveolar and parietoalveolar pneumonopathies 07/05/2005    Formatting of this note might be different from the original.    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     Past Surgical History:   Procedure Laterality Date    CESAREAN SECTION      HX HYSTERECTOMY      PARTIAL GASTRECTOMY       Family Medical History:       Problem Relation (Age of Onset)    COPD Mother, Father            Social History     Tobacco Use    Smoking status: Former     Current packs/day: 0.00     Average packs/day: 2.0 packs/day for 42.0 years (84.0 ttl pk-yrs)     Types: Cigarettes     Start date: 1966     Quit date: 2008     Years since quitting: 17.0    Smokeless tobacco: Never   Vaping Use    Vaping status: Never Used   Substance Use Topics    Drug use: Never       Review of Systems:  Review of Systems    Physical Exam:  Ht 1.549 m (5\' 1" )   Wt 48.1 kg (106 lb)   BMI 20.03 kg/m       ENT Physical Exam  Constitutional  Appearance: patient appears well-developed, well-nourished and well-groomed,  Communication/Voice: communication appropriate for developmental age; vocal quality normal;  Head and Face  Appearance: head appears normal, face appears normal and face appears atraumatic;  Palpation: facial palpation normal;  Salivary: glands normal;  Ear  Hearing: intact;  Auricles: right auricle normal; left auricle normal;  External Mastoids:  right external mastoid normal; left external mastoid normal;  Ear Canals: bilateral ear  canals impacted cerumen observed;  Tympanic Membranes: right tympanic membrane normal; left tympanic membrane normal;  Nose  External Nose: nares patent bilaterally; external nose normal;  Internal Nose: nasal mucosa normal; septum normal; bilateral inferior turbinates normal;  Oral Cavity/Oropharynx  Lips: normal;  Teeth: normal;  Gums: gingiva normal;  Tongue: normal;  Oral mucosa: normal;  Hard palate: normal;  Neck  Neck: neck normal; neck palpation normal;  Thyroid: thyroid normal;  Respiratory  Inspection: breathing unlabored; normal breathing rate;  Lymphatic  Palpation: lymph nodes normal;  Neurovestibular  Mental Status: alert and oriented;  Psychiatric: mood normal; affect is appropriate;  Cranial Nerves: cranial nerves intact;       Assessment:  ENCOUNTER DIAGNOSES     ICD-10-CM   1. 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 Arm skin lesion, right  L98.9   4. 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 Facial lesion  L98.9       Plan:  Medical records reviewed on 05/01/2023.  Reviewed pathology of R cheek bx-- SCCA in situ. Discussed Mohs then Recon.     Return for Follow up after Mohs.    Marcelline Deist, PA-C  The advanced practice clinician's documentation was reviewed/amended in its entirety with the assessment and plan portion completely performed independently by me during this separate encounter.

## 2023-05-02 ENCOUNTER — Other Ambulatory Visit (INDEPENDENT_AMBULATORY_CARE_PROVIDER_SITE_OTHER): Payer: Self-pay | Admitting: OTOLARYNGOLOGY

## 2023-05-02 DIAGNOSIS — D0439 Carcinoma in situ of skin of other parts of face: Secondary | ICD-10-CM

## 2023-05-11 ENCOUNTER — Encounter (INDEPENDENT_AMBULATORY_CARE_PROVIDER_SITE_OTHER): Payer: Self-pay | Admitting: PHYSICIAN ASSISTANT

## 2023-05-16 ENCOUNTER — Encounter (INDEPENDENT_AMBULATORY_CARE_PROVIDER_SITE_OTHER): Payer: Medicare Other | Admitting: PHYSICIAN ASSISTANT

## 2023-05-25 ENCOUNTER — Encounter (INDEPENDENT_AMBULATORY_CARE_PROVIDER_SITE_OTHER): Payer: Medicare Other | Admitting: PHYSICIAN ASSISTANT

## 2023-05-26 ENCOUNTER — Encounter (INDEPENDENT_AMBULATORY_CARE_PROVIDER_SITE_OTHER): Payer: Medicare Other | Admitting: PHYSICIAN ASSISTANT

## 2023-06-19 ENCOUNTER — Other Ambulatory Visit (INDEPENDENT_AMBULATORY_CARE_PROVIDER_SITE_OTHER): Payer: Self-pay | Admitting: OTOLARYNGOLOGY

## 2023-06-19 DIAGNOSIS — D0439 Carcinoma in situ of skin of other parts of face: Secondary | ICD-10-CM

## 2023-07-17 ENCOUNTER — Other Ambulatory Visit: Payer: Self-pay

## 2023-07-17 ENCOUNTER — Ambulatory Visit (INDEPENDENT_AMBULATORY_CARE_PROVIDER_SITE_OTHER): Payer: Self-pay | Admitting: OTOLARYNGOLOGY

## 2023-07-18 ENCOUNTER — Ambulatory Visit (INDEPENDENT_AMBULATORY_CARE_PROVIDER_SITE_OTHER): Admitting: PHYSICIAN ASSISTANT

## 2023-07-27 ENCOUNTER — Ambulatory Visit (INDEPENDENT_AMBULATORY_CARE_PROVIDER_SITE_OTHER): Admitting: PHYSICIAN ASSISTANT

## 2023-08-08 ENCOUNTER — Ambulatory Visit (INDEPENDENT_AMBULATORY_CARE_PROVIDER_SITE_OTHER): Payer: Self-pay | Admitting: OTOLARYNGOLOGY

## 2023-09-12 ENCOUNTER — Encounter (INDEPENDENT_AMBULATORY_CARE_PROVIDER_SITE_OTHER): Payer: Self-pay | Admitting: PHYSICIAN ASSISTANT

## 2023-09-12 ENCOUNTER — Other Ambulatory Visit: Payer: Self-pay

## 2023-09-12 ENCOUNTER — Ambulatory Visit: Payer: Self-pay | Attending: PHYSICIAN ASSISTANT | Admitting: PHYSICIAN ASSISTANT

## 2023-09-12 VITALS — BP 156/79 | HR 91 | Temp 97.6°F | Ht 61.0 in | Wt 95.0 lb

## 2023-09-12 DIAGNOSIS — Z01818 Encounter for other preprocedural examination: Secondary | ICD-10-CM | POA: Insufficient documentation

## 2023-09-12 DIAGNOSIS — E559 Vitamin D deficiency, unspecified: Secondary | ICD-10-CM | POA: Insufficient documentation

## 2023-09-12 DIAGNOSIS — J441 Chronic obstructive pulmonary disease with (acute) exacerbation: Secondary | ICD-10-CM | POA: Insufficient documentation

## 2023-09-12 DIAGNOSIS — I10 Essential (primary) hypertension: Secondary | ICD-10-CM | POA: Insufficient documentation

## 2023-09-12 MED ORDER — CEFDINIR 300 MG CAPSULE
300.0000 mg | ORAL_CAPSULE | Freq: Two times a day (BID) | ORAL | 0 refills | Status: DC
Start: 2023-09-12 — End: 2023-10-19

## 2023-09-12 MED ORDER — BENZONATATE 100 MG CAPSULE
100.0000 mg | ORAL_CAPSULE | Freq: Three times a day (TID) | ORAL | 0 refills | Status: DC | PRN
Start: 2023-09-12 — End: 2023-10-19

## 2023-09-12 NOTE — Nursing Note (Signed)
 09/12/23 1447   Fall Risk Assessment   Do you feel unsteady when standing or walking? Yes   Do you worry about falling? Yes   Have you fallen in the past year? Yes   How many times have you fallen? Once   Were you ever injured from falling? No
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
## 2023-09-12 NOTE — Progress Notes (Addendum)
 [facility] MEDICAL OFFICE [facility]  FAMILY MEDICINE, MEDICAL OFFICE BUILDING  [address]  [place] [address]  [phone]   CHANETTE DEMO  1951/02/23  [id number]    Date of Service: 09/12/2023  Chief complaint:   Chief Complaint   Patient presents with    Pre-op     Per pt going to remove some cancer spots on face.      Subjective:   Laura Oconnell is a 73 y.o. female and presents today for preop visit for skin cancer surgery on 7/1-7/2 with Dr. Henriette and Dr. Margean. Patient is planning for Mohs and reconstruction. Patient's PCP recently retired and this is her first visit here. No issues with anesthesia in the past. Denies cardiac hx other than high blood pressure. She notes no chest pain with exertion such as walking up stairs or walking longer distances.   She reports cough x 1.5 months, occasionally productive. She notes she was sick with upper resp symptoms x 3 days but the cough has lingered on. She does admit to increased SOB with her cough. She does have O2 at home. Denies fever. Taking robitussin DM without relief. Hx of COPD. Former smoker.   Denies chest pain, palpitations, nausea, vomiting, diarrhea, constipation, abdominal pain.      Past Medical History:   Diagnosis Date    Bronchiectasis with (acute) exacerbation (CMS HCC) 08/03/2021    Chronic obstructive pulmonary disease with acute exacerbation (CMS HCC) 09/18/2015    Chronic pain disorder 05/29/2015    Chronic, continuous use of opioids 08/03/2021    Closed right hip fracture, initial encounter (CMS HCC) 02/01/2021    Compression fracture 05/29/2015    Formatting of this note might be different from the original. Per Dr. Kalhorn, Neurosurgery; kyphoplasty x 2    Depression 05/29/2015    Difficulty with speech 08/03/2021    Esophageal reflux     Essential hypertension 05/29/2015    GAD (generalized anxiety disorder) 05/29/2015    Gastroesophageal reflux disease without esophagitis 05/29/2015    Hypocalcemia 05/29/2015    Hypoglycemia  11/25/2016    Hyponatremia 08/03/2021    Intracranial aneurysm 08/03/2021    Myofascial pain syndrome 06/29/2018    Neuropathic pain 06/29/2018    Opioid use agreement exists 06/29/2018    Osteoporosis 05/29/2015    Other specified alveolar and parietoalveolar pneumonopathies 07/05/2005    Formatting of this note might be different from the original.    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         Past Surgical History:   Procedure Laterality Date    CESAREAN SECTION      HX HYSTERECTOMY      PARTIAL GASTRECTOMY           Current Outpatient Medications   Medication Sig Dispense Refill    baclofen  (LIORESAL ) 10 mg Oral Tablet Take 1 Tablet (10 mg total) by mouth Three times a day as needed      benzonatate  (TESSALON ) 100 mg Oral Capsule Take 1 Capsule (100 mg total) by mouth Three times a day as needed for Cough for up to 10 days 30 Capsule 0    Calcium-Cholecalciferol , D3, 600 mg-5 mcg (200 unit) Oral Capsule Take 1 Capsule by mouth Once a day      cefdinir  (OMNICEF ) 300 mg Oral Capsule Take 1 Capsule (300 mg total) by mouth Twice daily for 10 days 20 Capsule 0  citalopram (CELEXA) 40 mg Oral Tablet Take 1 Tablet (40 mg total) by mouth Daily      COPPER ORAL Take 2 mg by mouth Once a day      Diltiazem  HCl (TIAZAC ) 120 mg Oral Capsule,Sustained Action 24 hr Take 1 Capsule (120 mg total) by mouth Once a day      ferrous sulfate  (FERATAB) 324 mg (65 mg iron ) Oral Tablet, Delayed Release (E.C.) Take 1 Tablet (324 mg total) by mouth Once a day      HYDROcodone -acetaminophen  (NORCO) 5-325 mg Oral Tablet Take 1 Tablet by mouth Every 6 hours as needed      nortriptyline (PAMELOR) 10 mg Oral Capsule Take 1 Capsule (10 mg total) by mouth Every night      oxyCODONE  (OXYCONTIN ) 10 mg Oral tablet,oral only,ext.rel.12 hr Take 1 Tablet (10 mg total) by mouth Every 12 hours      rOPINIRole  (REQUIP ) 0.5 mg Oral Tablet Take 1 Tablet (0.5 mg total) by mouth Every night  as needed      varenicline tartrate (CHANTIX) 0.5 mg Oral Tablet Take 1 Tablet (0.5 mg total) by mouth Daily       No current facility-administered medications for this visit.     Allergies[1]  Review of Systems:  Any pertinent Review of Systems as addressed in the HPI above.    Objective:   BP (!) 156/79   Pulse 91   Temp 36.4 C (97.6 F) (Temporal)   Ht 1.549 m (5' 1)   Wt 43.1 kg (95 lb)   SpO2 93%   BMI 17.95 kg/m       BMI addressed: Advised on importance of nutrition to increase below normal BMI.        Constitutional: No acute distress. Alert and oriented.  Head: Normocephalic, atraumatic.  Eyes: Clear. PERRL. EOMI.  Ears: Canals and TMs clear and intact bilaterally.  Nose: Patent. No rhinorrhea.  Throat: Clear, no erythema or drainge.  Neck: Supple. No lymphadenopathy. No bruit.   Lungs: Clear to auscultation bilaterally. No wheezes, rhonchi, or rales.  Heart: Regular rate and rhythm. No murmurs.  Abdomen: Soft, non-tender, nondistended. BS active.  Extremities: Intact x 4. No edema. Gait stable.  Neuro: CN 2-12 grossly intact.   Psych: Mood and affect appropriate and congruent.      Assessment & Plan  Preoperative clearance  Review labs, EKG, and Chest Xray prior to determining clearance.   ADDENDUM 10/03/23: Labs, EKG, and CXR stable. Patient is considered acceptable risk to proceed with scheduled surgery. Consider recheck CBC after surgery secondary to chronic leukopenia and thrombocytopenia.   Orders:    THYROID  STIMULATING HORMONE (SENSITIVE TSH); Future    HEPATIC FUNCTION PANEL; Future    XR CHEST PA AND LATERAL; Future    Chronic obstructive pulmonary disease with acute exacerbation (CMS HCC)  Chest xray to r/o pneumonia. Start abx. Will likely recheck her prior to her surgery.   Orders:    XR CHEST PA AND LATERAL; Future    cefdinir  (OMNICEF ) 300 mg Oral Capsule; Take 1 Capsule (300 mg total) by mouth Twice daily for 10 days    benzonatate  (TESSALON ) 100 mg Oral Capsule; Take 1 Capsule (100  mg total) by mouth Three times a day as needed for Cough for up to 10 days    Vitamin D  deficiency    Orders:    VITAMIN D  25 TOTAL; Future    Hypertension, unspecified type  Monitor BP at home and keep log. Call  if consistently elevated >140/90.   Orders:    THYROID  STIMULATING HORMONE (SENSITIVE TSH); Future           The patient was given the opportunity to ask questions and those questions were answered to the patient's satisfaction. The patient was encouraged to call with any additional questions or concerns. More than 50% of the visit was spent counseling and coordinating care.    Orders Placed This Encounter    XR CHEST PA AND LATERAL    THYROID  STIMULATING HORMONE (SENSITIVE TSH)    VITAMIN D  25 TOTAL    HEPATIC FUNCTION PANEL    cefdinir  (OMNICEF ) 300 mg Oral Capsule    benzonatate  (TESSALON ) 100 mg Oral Capsule                  Return pending labs, chest xray, and ekg results or sooner if symptoms worsen.  Derrek Patricia, PA-C 09/12/2023, 15:15         [1] No Known Allergies

## 2023-09-12 NOTE — Assessment & Plan Note (Addendum)
 Chest xray to r/o pneumonia. Start abx. Will likely recheck her prior to her surgery.   Orders:    XR CHEST PA AND LATERAL; Future    cefdinir (OMNICEF) 300 mg Oral Capsule; Take 1 Capsule (300 mg total) by mouth Twice daily for 10 days    benzonatate (TESSALON) 100 mg Oral Capsule; Take 1 Capsule (100 mg total) by mouth Three times a day as needed for Cough for up to 10 days

## 2023-09-15 ENCOUNTER — Ambulatory Visit (HOSPITAL_BASED_OUTPATIENT_CLINIC_OR_DEPARTMENT_OTHER)
Admission: RE | Admit: 2023-09-15 | Discharge: 2023-09-15 | Disposition: A | Discharge status: Home / Self Care | Source: Ambulatory Visit | Attending: PHYSICIAN ASSISTANT

## 2023-09-15 ENCOUNTER — Ambulatory Visit (HOSPITAL_COMMUNITY): Attending: PHYSICIAN ASSISTANT

## 2023-09-15 ENCOUNTER — Other Ambulatory Visit: Payer: Self-pay

## 2023-09-15 DIAGNOSIS — Z01818 Encounter for other preprocedural examination: Secondary | ICD-10-CM | POA: Insufficient documentation

## 2023-09-15 DIAGNOSIS — J441 Chronic obstructive pulmonary disease with (acute) exacerbation: Secondary | ICD-10-CM | POA: Insufficient documentation

## 2023-09-15 DIAGNOSIS — E559 Vitamin D deficiency, unspecified: Secondary | ICD-10-CM | POA: Insufficient documentation

## 2023-09-15 DIAGNOSIS — I10 Essential (primary) hypertension: Secondary | ICD-10-CM | POA: Insufficient documentation

## 2023-09-15 DIAGNOSIS — R059 Cough, unspecified: Secondary | ICD-10-CM

## 2023-09-15 LAB — HEPATIC FUNCTION PANEL
ALBUMIN/GLOBULIN RATIO: 1.3 (ref 0.8–1.4)
ALBUMIN: 4.1 g/dL (ref 3.5–5.7)
ALKALINE PHOSPHATASE: 69 U/L (ref 34–104)
ALT (SGPT): 9 U/L (ref 7–52)
AST (SGOT): 16 U/L (ref 13–39)
BILIRUBIN DIRECT: 0.03 md/dL (ref 0.03–0.18)
BILIRUBIN TOTAL: 0.3 mg/dL (ref 0.3–1.0)
BILIRUBIN, INDIRECT: 0.27 mg/dL (ref <=1)
GLOBULIN: 3.1 (ref 2.0–3.5)
PROTEIN TOTAL: 7.2 g/dL (ref 6.4–8.9)

## 2023-09-15 LAB — VITAMIN D 25 TOTAL: VITAMIN D 25, TOTAL: 34.37 ng/mL (ref 30.00–100.00)

## 2023-09-15 LAB — THYROID STIMULATING HORMONE (SENSITIVE TSH): TSH: 1.952 u[IU]/mL (ref 0.450–5.330)

## 2023-09-16 ENCOUNTER — Ambulatory Visit (INDEPENDENT_AMBULATORY_CARE_PROVIDER_SITE_OTHER): Payer: Self-pay | Admitting: PHYSICIAN ASSISTANT

## 2023-09-19 ENCOUNTER — Other Ambulatory Visit (INDEPENDENT_AMBULATORY_CARE_PROVIDER_SITE_OTHER): Payer: Self-pay | Admitting: PHYSICIAN ASSISTANT

## 2023-09-19 DIAGNOSIS — I10 Essential (primary) hypertension: Secondary | ICD-10-CM

## 2023-09-21 ENCOUNTER — Ambulatory Visit: Attending: OTOLARYNGOLOGY

## 2023-09-21 ENCOUNTER — Other Ambulatory Visit: Payer: Self-pay

## 2023-09-21 DIAGNOSIS — D0439 Carcinoma in situ of skin of other parts of face: Secondary | ICD-10-CM | POA: Insufficient documentation

## 2023-09-21 LAB — BASIC METABOLIC PANEL
ANION GAP: 5 mmol/L (ref 4–13)
BUN/CREA RATIO: 25 — ABNORMAL HIGH (ref 6–22)
BUN: 24 mg/dL (ref 7–25)
CALCIUM: 9.4 mg/dL (ref 8.6–10.3)
CHLORIDE: 102 mmol/L (ref 98–107)
CO2 TOTAL: 32 mmol/L — ABNORMAL HIGH (ref 21–31)
CREATININE: 0.95 mg/dL (ref 0.60–1.30)
ESTIMATED GFR: 63 mL/min/{1.73_m2} (ref >59)
GLUCOSE: 86 mg/dL (ref 74–109)
OSMOLALITY, CALCULATED: 281 mosm/kg (ref 270–290)
POTASSIUM: 5.2 mmol/L — ABNORMAL HIGH (ref 3.5–5.1)
SODIUM: 139 mmol/L (ref 136–145)

## 2023-09-21 LAB — CBC WITH DIFF
BASOPHIL #: 0.1 10*3/uL (ref 0.00–0.10)
BASOPHIL %: 2 % — ABNORMAL HIGH (ref 0–1)
EOSINOPHIL #: 0.1 10*3/uL (ref 0.00–0.50)
EOSINOPHIL %: 3 % (ref 1–7)
HCT: 36.5 % (ref 31.2–41.9)
HGB: 12 g/dL (ref 10.9–14.3)
LYMPHOCYTE #: 0.5 10*3/uL — ABNORMAL LOW (ref 1.10–3.10)
LYMPHOCYTE %: 17 % (ref 16–46)
MCH: 29.3 pg (ref 24.7–32.8)
MCHC: 33 g/dL (ref 32.3–35.6)
MCV: 88.9 fL (ref 75.5–95.3)
MONOCYTE #: 0.3 10*3/uL (ref 0.20–0.90)
MONOCYTE %: 11 % (ref 4–11)
MPV: 6.4 fL — ABNORMAL LOW (ref 7.9–10.8)
NEUTROPHIL #: 2 10*3/uL (ref 1.90–8.20)
NEUTROPHIL %: 68 % (ref 43–77)
PLATELETS: 108 10*3/uL — ABNORMAL LOW (ref 140–440)
RBC: 4.11 10*6/uL (ref 3.63–4.92)
RDW: 15.7 % (ref 12.3–17.7)
WBC: 2.9 10*3/uL — ABNORMAL LOW (ref 3.8–11.8)

## 2023-09-21 LAB — PT/INR
INR: 0.93 (ref 0.84–1.10)
PROTHROMBIN TIME: 10.5 s (ref 9.8–12.7)

## 2023-09-21 LAB — PTT (PARTIAL THROMBOPLASTIN TIME): APTT: 34.3 s (ref 25.0–38.0)

## 2023-09-22 ENCOUNTER — Ambulatory Visit (INDEPENDENT_AMBULATORY_CARE_PROVIDER_SITE_OTHER): Payer: Self-pay | Admitting: PHYSICIAN ASSISTANT

## 2023-09-22 DIAGNOSIS — E875 Hyperkalemia: Secondary | ICD-10-CM

## 2023-10-02 ENCOUNTER — Ambulatory Visit: Attending: PHYSICIAN ASSISTANT

## 2023-10-02 ENCOUNTER — Other Ambulatory Visit: Payer: Self-pay

## 2023-10-02 DIAGNOSIS — E875 Hyperkalemia: Secondary | ICD-10-CM | POA: Insufficient documentation

## 2023-10-02 LAB — BASIC METABOLIC PANEL
ANION GAP: 5 mmol/L (ref 4–13)
BUN/CREA RATIO: 21 (ref 6–22)
BUN: 15 mg/dL (ref 7–25)
CALCIUM: 9.2 mg/dL (ref 8.6–10.3)
CHLORIDE: 103 mmol/L (ref 98–107)
CO2 TOTAL: 32 mmol/L — ABNORMAL HIGH (ref 21–31)
CREATININE: 0.71 mg/dL (ref 0.60–1.30)
ESTIMATED GFR: 89 mL/min/1.73mˆ2 (ref >59)
GLUCOSE: 93 mg/dL (ref 74–109)
OSMOLALITY, CALCULATED: 280 mosm/kg (ref 270–290)
POTASSIUM: 4.2 mmol/L (ref 3.5–5.1)
SODIUM: 140 mmol/L (ref 136–145)

## 2023-10-03 ENCOUNTER — Encounter (INDEPENDENT_AMBULATORY_CARE_PROVIDER_SITE_OTHER): Payer: Self-pay | Admitting: OTOLARYNGOLOGY

## 2023-10-03 ENCOUNTER — Ambulatory Visit: Payer: Self-pay | Attending: OTOLARYNGOLOGY | Admitting: OTOLARYNGOLOGY

## 2023-10-03 ENCOUNTER — Ambulatory Visit (INDEPENDENT_AMBULATORY_CARE_PROVIDER_SITE_OTHER): Payer: Self-pay | Admitting: PHYSICIAN ASSISTANT

## 2023-10-03 VITALS — Ht 61.0 in | Wt 93.0 lb

## 2023-10-03 DIAGNOSIS — D0439 Carcinoma in situ of skin of other parts of face: Secondary | ICD-10-CM | POA: Insufficient documentation

## 2023-10-03 DIAGNOSIS — L989 Disorder of the skin and subcutaneous tissue, unspecified: Secondary | ICD-10-CM | POA: Insufficient documentation

## 2023-10-03 NOTE — H&P (View-Only) (Signed)
 ENT, [facility]  [address]  [place] [address]  Operated by [facility]  Progress Note    Name: Laura Oconnell MRN:  [id number]   Date: 10/03/2023 DOB:  02/27/1951 (73 y.o.)              Follow Up      Subjective:   Chief Complaint:   Skin Cancer (Pw for MOHS repair)       History of Present Illness:  Laura Oconnell is a 73 y.o. old female who presents to the clinic for follow-up after mohs with dermatology. Patient denies pain or bleeding.     Review of Systems     Physical Exam:     Vitals:    10/03/23 1419   Weight: 42.2 kg (93 lb)   Height: 1.549 m (5' 1)   BMI: 17.57      ENT Physical Exam  Constitutional  Appearance: patient appears well-developed, well-nourished and well-groomed,  Communication/Voice: communication appropriate for developmental age; vocal quality normal;  Head and Face  Appearance: head appears normal, face appears normal and face appears atraumatic;  Palpation: facial palpation normal;  Salivary: glands normal;  Ear  Hearing: intact;  Auricles: right auricle normal; left auricle normal;  External Mastoids: right external mastoid normal; left external mastoid normal;  Ear Canals: right ear canal normal; left ear canal normal;  Tympanic Membranes: right tympanic membrane normal; left tympanic membrane normal;  Nose  External Nose: nares patent bilaterally; external nose normal;  Internal Nose: nasal mucosa normal; septum normal; bilateral inferior turbinates normal;  Oral Cavity/Oropharynx  Lips: normal;  Teeth: normal;  Gums: gingiva normal;  Tongue: normal;  Oral mucosa: normal;  Hard palate: normal;  Neck  Neck: neck normal; neck palpation normal;  Thyroid : thyroid  normal;  Respiratory  Inspection: breathing unlabored; normal breathing rate;  Lymphatic  Palpation: lymph nodes normal;  Neurovestibular  Mental Status: alert and oriented;  Psychiatric: mood normal; affect is appropriate;  Cranial Nerves: cranial nerves intact;       Assessment and Plan:        ICD-10-CM    1. Facial lesion  L98.9       2. 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    Follow up:  No follow-ups on file.    Oneil Arch, DO

## 2023-10-03 NOTE — H&P (Signed)
 ENT, [facility]  [address]  [place] [address]  Operated by [facility]  Progress Note    Name: Laura Oconnell MRN:  [id number]   Date: 10/03/2023 DOB:  02/27/1951 (73 y.o.)              Follow Up      Subjective:   Chief Complaint:   Skin Cancer (Pw for MOHS repair)       History of Present Illness:  Laura Oconnell is a 73 y.o. old female who presents to the clinic for follow-up after mohs with dermatology. Patient denies pain or bleeding.     Review of Systems     Physical Exam:     Vitals:    10/03/23 1419   Weight: 42.2 kg (93 lb)   Height: 1.549 m (5' 1)   BMI: 17.57      ENT Physical Exam  Constitutional  Appearance: patient appears well-developed, well-nourished and well-groomed,  Communication/Voice: communication appropriate for developmental age; vocal quality normal;  Head and Face  Appearance: head appears normal, face appears normal and face appears atraumatic;  Palpation: facial palpation normal;  Salivary: glands normal;  Ear  Hearing: intact;  Auricles: right auricle normal; left auricle normal;  External Mastoids: right external mastoid normal; left external mastoid normal;  Ear Canals: right ear canal normal; left ear canal normal;  Tympanic Membranes: right tympanic membrane normal; left tympanic membrane normal;  Nose  External Nose: nares patent bilaterally; external nose normal;  Internal Nose: nasal mucosa normal; septum normal; bilateral inferior turbinates normal;  Oral Cavity/Oropharynx  Lips: normal;  Teeth: normal;  Gums: gingiva normal;  Tongue: normal;  Oral mucosa: normal;  Hard palate: normal;  Neck  Neck: neck normal; neck palpation normal;  Thyroid : thyroid  normal;  Respiratory  Inspection: breathing unlabored; normal breathing rate;  Lymphatic  Palpation: lymph nodes normal;  Neurovestibular  Mental Status: alert and oriented;  Psychiatric: mood normal; affect is appropriate;  Cranial Nerves: cranial nerves intact;       Assessment and Plan:        ICD-10-CM    1. Facial lesion  L98.9       2. 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    Follow up:  No follow-ups on file.    Oneil Arch, DO

## 2023-10-04 ENCOUNTER — Other Ambulatory Visit: Payer: Self-pay

## 2023-10-04 ENCOUNTER — Ambulatory Visit (HOSPITAL_COMMUNITY): Admitting: Certified Registered"

## 2023-10-04 ENCOUNTER — Ambulatory Visit
Admission: RE | Admit: 2023-10-04 | Discharge: 2023-10-04 | Disposition: A | Discharge status: Home / Self Care | Source: Ambulatory Visit | Attending: OTOLARYNGOLOGY | Admitting: OTOLARYNGOLOGY

## 2023-10-04 ENCOUNTER — Encounter (HOSPITAL_COMMUNITY)
Admission: RE | Disposition: A | Discharge status: Home / Self Care | Payer: Self-pay | Source: Ambulatory Visit | Attending: OTOLARYNGOLOGY

## 2023-10-04 ENCOUNTER — Encounter (HOSPITAL_COMMUNITY): Payer: Self-pay | Admitting: OTOLARYNGOLOGY

## 2023-10-04 DIAGNOSIS — D0439 Carcinoma in situ of skin of other parts of face: Secondary | ICD-10-CM | POA: Insufficient documentation

## 2023-10-04 SURGERY — PLACEMENT FLAP LOCAL
Anesthesia: General | Site: Face | Laterality: Right | Wound class: Clean Wound: Uninfected operative wounds in which no inflammation occurred

## 2023-10-04 MED ORDER — SODIUM CHLORIDE 0.9 % (FLUSH) INJECTION SYRINGE
3.0000 mL | INJECTION | INTRAMUSCULAR | Status: DC | PRN
Start: 2023-10-04 — End: 2023-10-04

## 2023-10-04 MED ORDER — ETHYL ALCOHOL 62 % TOPICAL SWAB
1.0000 | Freq: Once | CUTANEOUS | Status: AC
Start: 2023-10-04 — End: 2023-10-04
  Administered 2023-10-04: 1 via NASAL

## 2023-10-04 MED ORDER — IPRATROPIUM 0.5 MG-ALBUTEROL 3 MG (2.5 MG BASE)/3 ML NEBULIZATION SOLN
3.0000 mL | INHALATION_SOLUTION | Freq: Once | RESPIRATORY_TRACT | Status: DC | PRN
Start: 2023-10-04 — End: 2023-10-04

## 2023-10-04 MED ORDER — MUPIROCIN 2 % TOPICAL OINTMENT
TOPICAL_OINTMENT | Freq: Once | CUTANEOUS | Status: DC | PRN
Start: 2023-10-04 — End: 2023-10-04
  Administered 2023-10-04: 2 via TOPICAL

## 2023-10-04 MED ORDER — ALBUTEROL SULFATE 2.5 MG/3 ML (0.083 %) SOLUTION FOR NEBULIZATION
2.5000 mg | INHALATION_SOLUTION | Freq: Once | RESPIRATORY_TRACT | Status: DC | PRN
Start: 2023-10-04 — End: 2023-10-04

## 2023-10-04 MED ORDER — ONDANSETRON HCL (PF) 4 MG/2 ML INJECTION SOLUTION
4.0000 mg | Freq: Once | INTRAMUSCULAR | Status: DC | PRN
Start: 2023-10-04 — End: 2023-10-04

## 2023-10-04 MED ORDER — FENTANYL (PF) 50 MCG/ML INJECTION SOLUTION
INTRAMUSCULAR | Status: AC
Start: 2023-10-04 — End: 2023-10-04
  Filled 2023-10-04: qty 2

## 2023-10-04 MED ORDER — HYDROCODONE 7.5 MG-ACETAMINOPHEN 325 MG/15 ML ORAL SOLUTION
5.0000 mg | Freq: Four times a day (QID) | ORAL | Status: DC | PRN
Start: 2023-10-04 — End: 2023-10-04
  Administered 2023-10-04: 5 mg via ORAL
  Filled 2023-10-04: qty 15

## 2023-10-04 MED ORDER — SODIUM CHLORIDE 0.9 % INTRAVENOUS SOLUTION
INTRAVENOUS | Status: AC
Start: 2023-10-04 — End: 2023-10-04
  Filled 2023-10-04: qty 50

## 2023-10-04 MED ORDER — MIDAZOLAM 5 MG/ML INJECTION WRAPPER
INTRAMUSCULAR | Status: AC
Start: 2023-10-04 — End: 2023-10-04
  Filled 2023-10-04: qty 1

## 2023-10-04 MED ORDER — MIDAZOLAM 5 MG/ML INJECTION WRAPPER
Freq: Once | INTRAMUSCULAR | Status: DC | PRN
Start: 2023-10-04 — End: 2023-10-04
  Administered 2023-10-04 (×2): 2 mg via INTRAVENOUS
  Administered 2023-10-04: 1 mg via INTRAVENOUS

## 2023-10-04 MED ORDER — LACTATED RINGERS INTRAVENOUS SOLUTION
INTRAVENOUS | Status: DC
Start: 2023-10-04 — End: 2023-10-04

## 2023-10-04 MED ORDER — LIDOCAINE (PF) 100 MG/5 ML (2 %) INTRAVENOUS SYRINGE
INJECTION | Freq: Once | INTRAVENOUS | Status: DC | PRN
Start: 2023-10-04 — End: 2023-10-04
  Administered 2023-10-04: 50 mg via INTRAVENOUS

## 2023-10-04 MED ORDER — SODIUM CHLORIDE 0.9 % (FLUSH) INJECTION SYRINGE
3.0000 mL | INJECTION | Freq: Three times a day (TID) | INTRAMUSCULAR | Status: DC
Start: 2023-10-04 — End: 2023-10-04

## 2023-10-04 MED ORDER — DEXAMETHASONE SODIUM PHOSPHATE 4 MG/ML INJECTION SOLUTION
INTRAMUSCULAR | Status: AC
Start: 2023-10-04 — End: 2023-10-04
  Filled 2023-10-04: qty 1

## 2023-10-04 MED ORDER — ONDANSETRON HCL (PF) 4 MG/2 ML INJECTION SOLUTION
4.0000 mg | Freq: Once | INTRAMUSCULAR | Status: AC
Start: 2023-10-04 — End: 2023-10-04
  Administered 2023-10-04: 4 mg via INTRAVENOUS

## 2023-10-04 MED ORDER — CEFAZOLIN 2 GRAM INTRAVENOUS SOLUTION
INTRAVENOUS | Status: AC
Start: 2023-10-04 — End: 2023-10-04
  Filled 2023-10-04: qty 14.71

## 2023-10-04 MED ORDER — LIDOCAINE-EPINEPHRINE (PF) 1 %-1:100,000 INJECTION SOLUTION
Freq: Once | INTRAMUSCULAR | Status: DC | PRN
Start: 2023-10-04 — End: 2023-10-04
  Administered 2023-10-04: 3 mL via INTRAMUSCULAR

## 2023-10-04 MED ORDER — PROCHLORPERAZINE EDISYLATE 10 MG/2 ML (5 MG/ML) INJECTION SOLUTION
5.0000 mg | Freq: Once | INTRAMUSCULAR | Status: DC | PRN
Start: 2023-10-04 — End: 2023-10-04

## 2023-10-04 MED ORDER — FENTANYL (PF) 50 MCG/ML INJECTION WRAPPER
25.0000 ug | INJECTION | INTRAMUSCULAR | Status: DC | PRN
Start: 2023-10-04 — End: 2023-10-04

## 2023-10-04 MED ORDER — ONDANSETRON HCL (PF) 4 MG/2 ML INJECTION SOLUTION
INTRAMUSCULAR | Status: AC
Start: 2023-10-04 — End: 2023-10-04
  Filled 2023-10-04: qty 2

## 2023-10-04 MED ORDER — FENTANYL (PF) 50 MCG/ML INJECTION WRAPPER
INJECTION | Freq: Once | INTRAMUSCULAR | Status: DC | PRN
Start: 2023-10-04 — End: 2023-10-04
  Administered 2023-10-04 (×4): 25 ug via INTRAVENOUS

## 2023-10-04 MED ORDER — FAMOTIDINE (PF) 20 MG/2 ML INTRAVENOUS SOLUTION
20.0000 mg | Freq: Once | INTRAVENOUS | Status: AC
Start: 2023-10-04 — End: 2023-10-04
  Administered 2023-10-04: 20 mg via INTRAVENOUS

## 2023-10-04 MED ORDER — MUPIROCIN 2 % TOPICAL OINTMENT
TOPICAL_OINTMENT | CUTANEOUS | Status: AC
Start: 2023-10-04 — End: 2023-10-04
  Filled 2023-10-04: qty 44

## 2023-10-04 MED ORDER — DEXAMETHASONE SODIUM PHOSPHATE 4 MG/ML INJECTION SOLUTION
4.0000 mg | Freq: Once | INTRAMUSCULAR | Status: AC
Start: 2023-10-04 — End: 2023-10-04
  Administered 2023-10-04: 4 mg via INTRAVENOUS

## 2023-10-04 MED ORDER — CEFAZOLIN 1 GRAM SOLUTION FOR INJECTION
Freq: Once | INTRAMUSCULAR | Status: DC | PRN
Start: 2023-10-04 — End: 2023-10-04
  Administered 2023-10-04: 2000 mg via INTRAVENOUS

## 2023-10-04 MED ORDER — FENTANYL (PF) 50 MCG/ML INJECTION WRAPPER
50.0000 ug | INJECTION | INTRAMUSCULAR | Status: DC | PRN
Start: 2023-10-04 — End: 2023-10-04

## 2023-10-04 MED ORDER — FAMOTIDINE (PF) 20 MG/2 ML INTRAVENOUS SOLUTION
INTRAVENOUS | Status: AC
Start: 2023-10-04 — End: 2023-10-04
  Filled 2023-10-04: qty 2

## 2023-10-04 MED ORDER — PROPOFOL 10 MG/ML IV BOLUS
INJECTION | Freq: Once | INTRAVENOUS | Status: DC | PRN
Start: 2023-10-04 — End: 2023-10-04
  Administered 2023-10-04: 100 mg via INTRAVENOUS

## 2023-10-04 SURGICAL SUPPLY — 23 items
BLADE 15 2 END CBNSTL SURG STRL DISP (SURGICAL CUTTING SUPPLIES) ×2 IMPLANT
CLEANER ESURG TIP 2X2IN TIP POLISHR CAUT STRL LF (SURGICAL CUTTING SUPPLIES) ×1 IMPLANT
CLEANER INSTRUMENT PRE-KLENZ 13.5 OZ (MISCELLANEOUS PT CARE ITEMS) ×1 IMPLANT
CORD BIPOLAR 12FT CAUT STD FORCEPS STRL LF (ENDOSCOPIC SUPPLIES) ×1 IMPLANT
COUNTER 20 CNT BLOCK ADH NEEDLE STRL LF  RD SHARP FOAM 15.75X11.5X14IN DISP (MED SURG SUPPLIES) ×1 IMPLANT
GLOVE SURG 6.5 LF  PF BEAD CUF STRL CRM 11.3IN PROTEXIS PI PLISPRN THK9.1 MIL (GLOVES AND ACCESSORIES) ×2 IMPLANT
GLOVE SURG 7.5 LF  PF BEAD CUF STRL CRM 11.8IN PROTEXIS PI PLISPRN THK9.1 MIL (GLOVES AND ACCESSORIES) ×1 IMPLANT
GOWN SURG XL STD LGTH L3 HKLP CLSR RGLN SLEEVE TWL STRL LF  DISP GRN AERO BLU PRFRM FBRC (DRAPE/PACKS/SHEETS/OR TOWEL) ×1 IMPLANT
LABEL MED CORRECT MED LABELING SYS 4 FLG 2 SHEET 24 PRPRNT STRL (MED SURG SUPPLIES) ×1 IMPLANT
NEEDLE HYPO  18GA 1.5IN REG WL BD PRCSNGL POLYPROP REG BVL LL HUB CLR CD DEHP-FR STRL LF  DISP (MED SURG SUPPLIES) ×1 IMPLANT
NEEDLE HYPO  27GA 1.5IN REG WL PRCSNGL SS POLYPROP REG BVL LL HUB DEHP-FR GRY STRL LF  DISP (MED SURG SUPPLIES) ×1 IMPLANT
PACK SURG ECLIPSE ENT I STRL LF (CUSTOM TRAYS & PACK) ×1 IMPLANT
PEN SURG MRKNG DISP RLR LBL STRL LF  6IN (MED SURG SUPPLIES) ×1 IMPLANT
SOL IRRG 0.9% NACL 1000ML PLASTIC PR BTL ISTNC N-PYRG STRL LF (MEDICATIONS/SOLUTIONS) ×1 IMPLANT
SPONGE GAUZE 4X4IN MDCHC COTTON 12 PLY TY 7 LF  STRL DISP (WOUND CARE SUPPLY) ×2 IMPLANT
SUTURE 3-0 FS2 VICRYL+ 27IN UNDYED BRD ANBCTRL COAT ABS (SUTURE/WOUND CLOSURE) ×1 IMPLANT
SUTURE 4-0 P-3 MONOCRYL MTPS 18IN UNDYED MONOF ABS (SUTURE/WOUND CLOSURE) ×1 IMPLANT
SUTURE 5-0 P-3 MONOCRYL MTPS 18IN UNDYED MONOF ABS (SUTURE/WOUND CLOSURE) ×1 IMPLANT
SUTURE 5-0 PC-1 PROLENE MTPS 18IN BLU MONOF NONAB (SUTURE/WOUND CLOSURE) ×1 IMPLANT
SUTURE 6-0 PC-1 PROLENE MTPS 18IN BLU MONOF NONAB (SUTURE/WOUND CLOSURE) ×1 IMPLANT
SYRINGE LL 10ML LF  STRL GRAD N-PYRG DEHP-FR PVC FREE MED DISP (MED SURG SUPPLIES) ×1 IMPLANT
TOWEL 24X16IN COTTON BLU DISP SURG STRL LF (DRAPE/PACKS/SHEETS/OR TOWEL) ×2 IMPLANT
TUBING SUCT CLR 6FT .25IN ARGYLE PVC NCDTV STR MALE FEMALE MLD CONN STRL LF (MED SURG SUPPLIES) ×1 IMPLANT

## 2023-10-04 NOTE — Interval H&P Note (Signed)
 [facility]      H&P UPDATE FORM                                                                                  Laura, Oconnell, 73 y.o. female  Date of Admission:  10/04/2023  Date of Birth:  1950/05/27    10/04/2023    STOP: IF H&P IS GREATER THAN 30 DAYS FROM SURGICAL DAY COMPLETE NEW H&P IS REQUIRED.     H & P updated the day of the procedure.  1.  H&P completed within 30 days of surgical procedure and has been reviewed within 24 hours of admission but prior to surgery or a procedure requiring anesthesia services, the patient has been examined, and no change has occured in the patients condition since the H&P was completed.       Change in medications: No        No LMP recorded. Patient has had a hysterectomy.      Comments:     2.  Patient continues to be appropriate candidate for planned surgical procedure. YES    Oneil Arch, DO

## 2023-10-04 NOTE — Anesthesia Preprocedure Evaluation (Signed)
 ANESTHESIA PRE-OP EVALUATION  Planned Procedure: REPAIR OF MOHS DEFECT RIGHT CHEEK WITH FLAP CLOSURE; POSSIBLE SKIN GRAFT (Right: Face)  Review of Systems     anesthesia history negative     patient summary reviewed  nursing notes reviewed        Pulmonary   COPD, moderate and past history of smoking ,   Cardiovascular    Hypertension and well controlled ,No peripheral edema,  Exercise Tolerance: > or = 4 METS        GI/Hepatic/Renal    GERD and well controlled        Endo/Other   neg endo/other ROS,       Neuro/Psych/MS    fibromyalgia, anxiety, depression     Cancer    negative hematology/oncology ROS,               Physical Assessment      Airway       Mallampati: III    TM distance: 3 FB    Neck ROM: full  Mouth Opening: good.            Dental           (+) upper dentures, lower dentures           Pulmonary    Breath sounds clear to auscultation  (-) no rhonchi, no decreased breath sounds, no wheezes, no rales and no stridor     Cardiovascular    Rhythm: regular  Rate: Normal  (-) no friction rub, carotid bruit is not present, no peripheral edema and no murmur     Other findings          Plan  ASA 3     Planned anesthesia type: general     general anesthesia with laryngeal mask airway      PONV Plan:  I plan to administer pharmcologic prophalaxis antiemetics                  Anesthesia issues/risks discussed are: Stroke, Aspiration, Cardiac Events/MI, Intraoperative Awareness/ Recall and Sore Throat.  Anesthetic plan and risks discussed with patient  signed consent obtained          Patient's NPO status is appropriate for Anesthesia.           Plan discussed with CRNA.

## 2023-10-04 NOTE — Anesthesia Procedure Notes (Signed)
 Laura Oconnell    Airway Note  General Information and Staff   Authorizing provider: Cornett, Theadore Rigg, MD  Performing provider: Naomi Pines, CRNA        Reason: elective    Airway not difficult    Indications and Patient Condition  Pt location: In Or  Indications for airway management: anesthesia  Sedation level: deep      Preoxygenated: yesPatient position: sniffing    Mask difficulty assessment: 1 - vent by mask        Final Airway Details    Final airway type: supraglottic airway    Successful airway: Standard  Size: #3                   Secured with: Tape  Number of attempts at approach: 1  Ventilation between attempts: none  Number of other approaches attempted: 0Airway complications: Atraumatic

## 2023-10-04 NOTE — OR Surgeon (Addendum)
 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    First Assist:  None     Anesthesia:  General     IVF:   Please see anesthesia record    Estimated Blood Loss:  Minimal    Specimens:  None    Implants/Devices:  None    Drains:  None    Complications:  None immediate    Condition in PACU:  Stable    Indications For Procedure:  Laura Oconnell is a very pleasant 73 y.o. who presents with the aforementioned defect and diagnosis. Risks, benefits, indications, alternatives and complications of procedure were discussed in great detail. Patient understands and would like to proceed. Informed signed consent obtained.  Risks include but are not limited to bleeding, infection, scarring, deformity, nerve injury and the possible need for a multistage procedure.            Description of Procedure:  After informed consent was reviewed and confirmed in the preoperative holding area the patient was transferred to the operating suite and placed on the table in the supine position.  The patient was correctly identified by name as well as wrist band.  Appropriate timeout was taken prior to the start of the procedure.  Next the anesthesiologist successfully induced and intubated the patient.  The table was then rotated 90 away from the anesthesia circuit. Next, the patient was then prepped and draped in a sterile fashion.  Thorough evaluation of the defect and planned incisions for a rotational advancement flap was drawn with  a skin marker.  1% lidocaine  with 100,000 epinephrine  was injected into the preplanned incisions.  Using #15 blade skin incisions/ back cut was made.  Next adequate undermining along the periphery including elevation and mobilization of the flap encompassed 24 cm2 of total soft tissue rearrangement.  The flap was then rotated and advanced into the defect in a preplanned position.  Multiple deep dermal and subcutaneous tacking sutures with 4-0 and 5-0 Biosyn in a buried interrupted fashion was performed.  Donor site standing cutaneous deformity was resolved by removal of a burrows triangle to improve transitions.  Skin was closed with running 6-0 Prolene as well as a few strategically position simple interrupted 6-0 Prolene sutures.  Bactroban  ointment was applied to wound.  Patient's care was then transferred to the anesthesiologist, patient was awoken and transferred to the postanesthesia care unit in stable condition.  All instrument needle and sponge counts were deemed correct at the end of the case.    Oneil Arch, DO ,M.M.S. 10/04/2023 11:07  Otolaryngology/Facial Plastic Surgery

## 2023-10-04 NOTE — Discharge Instructions (Addendum)
 Clean incision four times a day with saline and apply antibiotic ointment provided after each cleaning    Follow any post op instructions given by Dr Margean    Call the office with any problems, questions, concerns.    You may shower but do not let the shower stream strike the incision.    Resume home meds.    Rx for home: Keflex 500 mg TID, for 7 days.

## 2023-10-04 NOTE — Anesthesia Postprocedure Evaluation (Signed)
 Anesthesia Post Op Evaluation    Patient: Laura Oconnell  Procedure(s):  REPAIR OF MOHS DEFECT RIGHT CHEEK WITH FLAP CLOSURE; POSSIBLE SKIN GRAFT    Last Vitals:Temperature: 36.4 C (97.5 F) (10/04/23 1059)  Heart Rate: 93 (10/04/23 1059)  BP (Non-Invasive): (!) 152/74 (10/04/23 1059)  Respiratory Rate: 14 (10/04/23 1059)  SpO2: 100 % (10/04/23 1059)    No notable events documented.    Patient is sufficiently recovered from the effects of anesthesia to participate in the evaluation and has returned to their pre-procedure level.  Patient location during evaluation: PACU       Patient participation: complete - patient participated  Level of consciousness: awake and alert and responsive to verbal stimuli    Pain management: adequate  Airway patency: patent    Anesthetic complications: no  Cardiovascular status: acceptable  Respiratory status: acceptable  Hydration status: acceptable  Patient post-procedure temperature: Pt Normothermic   PONV Status: Absent

## 2023-10-04 NOTE — OR PostOp (Signed)
 Patient transported to Day surgery Room 5 via stretcher and on nasal cannula at 2L. Nurse Meade Darletta PEAK present. Patient moved self from stretcher over to bed. Vitals as follows: 97.7, 103, 162/69, 16 and 92% room air; patient encouraged to cough. Surgical sutures and ointment intact right facial cheek with no drainage. IV patent. Patient awake and alert.

## 2023-10-04 NOTE — Anesthesia Transfer of Care (Signed)
 ANESTHESIA TRANSFER OF CARE   Laura Oconnell is a 73 y.o. ,female,     had Procedure(s):  REPAIR OF MOHS DEFECT RIGHT CHEEK WITH FLAP CLOSURE; POSSIBLE SKIN GRAFT  performed  10/04/23   Primary Service: Oneil Arch, DO    Past Medical History:   Diagnosis Date    Bronchiectasis with (acute) exacerbation (CMS HCC) 08/03/2021    Chronic obstructive pulmonary disease with acute exacerbation (CMS HCC) 09/18/2015    Chronic pain disorder 05/29/2015    Chronic, continuous use of opioids 08/03/2021    Closed right hip fracture, initial encounter (CMS HCC) 02/01/2021    Compression fracture 05/29/2015    Formatting of this note might be different from the original. Per Dr. 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      Allergy History as of 10/04/23        No Known Allergies                  I completed my transfer of care / handoff to the receiving personnel during which we discussed:  Access, Airway, All key/critical aspects of case discussed, Analgesia, Antibiotics, Expectation of post procedure, Fluids/Product, Gave opportunity for questions and acknowledgement of understanding, Labs and PMHx      Post Location: PACU                                                             Last OR Temp: Temperature: 36.4 C  (97.5 F)  ABG:  PH (ARTERIAL)   Date Value Ref Range Status   03/15/2022 7.36 7.35 - 7.45 Final     PCO2 (ARTERIAL)   Date Value Ref Range Status   03/15/2022 48 (H) 35 - 45 mm/Hg Final     PO2 (ARTERIAL)   Date Value Ref Range Status   03/15/2022 53 (L) 80 - 100 mm/Hg Final     POTASSIUM   Date Value Ref Range Status   10/02/2023 4.2 3.5 - 5.1 mmol/L Final     KETONES   Date Value Ref Range Status   03/15/2022 Negative Negative, Trace mg/dL Final     CALCIUM   Date Value Ref Range Status   10/02/2023 9.2 8.6 - 10.3 mg/dL Final     Calculated P Axis   Date Value Ref Range Status   03/15/2022 71 degrees Final     Calculated R Axis   Date Value Ref Range Status   03/15/2022 101 degrees Final     Calculated T Axis   Date Value Ref Range  Status   03/15/2022 58 degrees Final     CANNAQL   Date Value Ref Range Status   03/15/2022 Negative Negative Final     LACTATE   Date Value Ref Range Status   03/15/2022 0.6 <=2.0 mmol/L Final     OXYHEMOGLOBIN   Date Value Ref Range Status   03/15/2022 84.6 (LL) 88.0 - 100.0 % Final     CARBOXYHEMOGLOBIN   Date Value Ref Range Status   03/15/2022 1.4 <=1.5 % Final     MET-HEMOGLOBIN   Date Value Ref Range Status   03/15/2022 0.4 <=2.0 % Final     BASE EXCESS (ARTERIAL)   Date Value Ref Range Status   03/15/2022 1.9 0.0 - 2.0 mmol/L Final     BENZO QL   Date Value Ref Range Status   03/15/2022 Negative Negative Final     BICARBONATE (ARTERIAL)   Date Value Ref Range Status   03/15/2022 25.3 20.0 - 26.0 mmol/L Final     Airway:* No LDAs found *  Blood pressure (!) 152/74, pulse 93, temperature 36.4 C (97.5 F), resp. rate 14, SpO2 100%.

## 2023-10-06 NOTE — Addendum Note (Signed)
 Addendum  created 10/06/23 1806 by Naomi Pines, CRNA    Intraprocedure Meds edited

## 2023-10-12 ENCOUNTER — Other Ambulatory Visit (INDEPENDENT_AMBULATORY_CARE_PROVIDER_SITE_OTHER): Payer: Self-pay

## 2023-10-12 ENCOUNTER — Encounter (INDEPENDENT_AMBULATORY_CARE_PROVIDER_SITE_OTHER): Payer: Self-pay | Admitting: OTOLARYNGOLOGY

## 2023-10-13 ENCOUNTER — Ambulatory Visit (INDEPENDENT_AMBULATORY_CARE_PROVIDER_SITE_OTHER): Payer: Self-pay | Admitting: OTOLARYNGOLOGY

## 2023-10-17 ENCOUNTER — Ambulatory Visit (INDEPENDENT_AMBULATORY_CARE_PROVIDER_SITE_OTHER): Payer: Self-pay | Admitting: OTOLARYNGOLOGY

## 2023-10-19 ENCOUNTER — Other Ambulatory Visit: Payer: Self-pay

## 2023-10-19 ENCOUNTER — Ambulatory Visit: Payer: Self-pay | Attending: OTOLARYNGOLOGY | Admitting: OTOLARYNGOLOGY

## 2023-10-19 ENCOUNTER — Encounter (INDEPENDENT_AMBULATORY_CARE_PROVIDER_SITE_OTHER): Payer: Self-pay | Admitting: OTOLARYNGOLOGY

## 2023-10-19 DIAGNOSIS — D0439 Carcinoma in situ of skin of other parts of face: Secondary | ICD-10-CM | POA: Insufficient documentation

## 2023-10-19 DIAGNOSIS — Z48817 Encounter for surgical aftercare following surgery on the skin and subcutaneous tissue: Secondary | ICD-10-CM

## 2023-10-19 NOTE — H&P (Signed)
 [facility], [facility]  [address]  [place] [address]  Operated by [facility]  Return Patient Visit    Name: Laura Oconnell MRN:  [id number]   Date: 10/19/2023 DOB: 07-Jul-1950 (73 y.o.)       Referring Provider:  Margean Anes, DO    Reason for Visit:   Chief Complaint   Patient presents with    Post Op     Po rc after MOHS repair done on 10/04/23       History of Present Illness:  Laura Oconnell is a 73 y.o. female who is s/p Mohs Recon right face BCCA 10/04/23. Doing well. No complaints.         Patient History:  Problem List[1]    Current Outpatient Medications   Medication Sig    baclofen  (LIORESAL ) 10 mg Oral Tablet Take 1 Tablet (10 mg total) by mouth Three times a day as needed    Calcium-Cholecalciferol , D3, 600 mg-5 mcg (200 unit) Oral Capsule Take 1 Capsule by mouth Daily    citalopram (CELEXA) 40 mg Oral Tablet Take 1 Tablet (40 mg total) by mouth Daily    COPPER ORAL Take 2 mg by mouth Once a day    Diltiazem  HCl (TIAZAC ) 120 mg Oral Capsule,Sustained Action 24 hr Take 1 Capsule (120 mg total) by mouth Daily    ferrous sulfate  (FERATAB) 324 mg (65 mg iron ) Oral Tablet, Delayed Release (E.C.) Take 1 Tablet (324 mg total) by mouth Daily    nortriptyline (PAMELOR) 10 mg Oral Capsule Take 1 Capsule (10 mg total) by mouth Every night    rOPINIRole  (REQUIP ) 0.5 mg Oral Tablet Take 1 Tablet (0.5 mg total) by mouth Every night as needed    varenicline tartrate (CHANTIX) 0.5 mg Oral Tablet Take 1 Tablet (0.5 mg total) by mouth Daily      Allergies[2]  Past Medical History:   Diagnosis Date    Bronchiectasis with (acute) exacerbation (CMS HCC) 08/03/2021    Chronic obstructive pulmonary disease with acute exacerbation (CMS HCC) 09/18/2015    Chronic pain disorder 05/29/2015    Chronic, continuous use of opioids 08/03/2021    Closed right hip fracture, initial encounter (CMS HCC) 02/01/2021    Compression fracture 05/29/2015    Formatting of this note might be different from the original. Per Dr.  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      Past Surgical History:   Procedure Laterality Date    CESAREAN SECTION      HX HYSTERECTOMY      PARTIAL GASTRECTOMY        Family Medical History:       Problem Relation (Age of Onset)    COPD Mother, Father  Social History[3]    Review of Systems:  Review of Systems    Physical Exam:  Ht 1.549 m (5' 1)   Wt 42.6 kg (94 lb)   BMI 17.76 kg/m       ENT Physical Exam  Constitutional  Appearance: patient appears well-developed, well-nourished and well-groomed,  Communication/Voice: communication appropriate for developmental age; vocal quality normal;  Head and Face  Appearance: head appears normal, face appears normal and face appears atraumatic;  Palpation: facial palpation normal;  Salivary: glands normal;  Head and Face comments: Right cheek sutures C, D, I  Ear  Hearing: intact;  Auricles: right auricle normal; left auricle normal;  External Mastoids: right external mastoid normal; left external mastoid normal;  Ear Canals: right ear canal normal; left ear canal normal;  Tympanic Membranes: right tympanic membrane normal; left tympanic membrane normal;  Nose  External Nose: nares patent bilaterally; external nose normal;  Internal Nose: nasal  mucosa normal; septum normal; bilateral inferior turbinates normal;  Oral Cavity/Oropharynx  Lips: normal;  Teeth: normal;  Gums: gingiva normal;  Tongue: normal;  Oral mucosa: normal;  Hard palate: normal;  Neck  Neck: neck normal; neck palpation normal;  Thyroid : thyroid  normal;  Respiratory  Inspection: breathing unlabored; normal breathing rate;  Lymphatic  Palpation: lymph nodes normal;  Neurovestibular  Mental Status: alert and oriented;  Psychiatric: mood normal; affect is appropriate;  Cranial Nerves: cranial nerves intact;         Assessment:  ENCOUNTER DIAGNOSES     ICD-10-CM   1. 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  Sutures removed. Cont mupirocin .   Sutures removed.     No orders of the defined types were placed in this encounter.     Return in about 6 months (around 04/20/2024).    Barnie SHAUNNA Alvine, PA-C  The advanced practice clinician's documentation was reviewed/amended in its entirety with the assessment and plan portion completely performed independently by me during this separate encounter.          [1]   Patient Active Problem List  Diagnosis    Bronchiectasis with (acute) exacerbation (CMS HCC)    COPD (chronic obstructive pulmonary disease)    Chronic pain disorder    Chronic, continuous use of opioids    Closed right hip fracture, initial encounter (CMS HCC)    Compression fracture    Depression    Difficulty with speech    Essential hypertension    GAD (generalized anxiety disorder)    Gastroesophageal reflux disease without esophagitis    Hypocalcemia    Hypoglycemia    Hyponatremia    Intracranial aneurysm    Myofascial pain syndrome    Neuropathic pain    Opioid use agreement exists    Osteoporosis    Other specified alveolar and parietoalveolar pneumonopathies    Spasm of back muscles    Spondylosis of thoracic region without myelopathy or radiculopathy    Squamous cell carcinoma in situ (SCCIS) of skin of  forehead    Acute postoperative anemia due to expected blood loss    Blood loss anemia    Encounter for rehabilitation    Restless leg syndrome    Acute respiratory failure due to COVID-19 (CMS HCC)    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

## 2023-10-27 ENCOUNTER — Ambulatory Visit (INDEPENDENT_AMBULATORY_CARE_PROVIDER_SITE_OTHER): Payer: Self-pay | Admitting: PHYSICIAN ASSISTANT

## 2024-02-06 ENCOUNTER — Ambulatory Visit (INDEPENDENT_AMBULATORY_CARE_PROVIDER_SITE_OTHER): Payer: Self-pay | Admitting: PHYSICIAN ASSISTANT

## 2024-02-12 ENCOUNTER — Other Ambulatory Visit (HOSPITAL_COMMUNITY): Payer: Self-pay | Admitting: Family

## 2024-02-12 DIAGNOSIS — R971 Elevated cancer antigen 125 [CA 125]: Secondary | ICD-10-CM

## 2024-04-16 ENCOUNTER — Encounter (INDEPENDENT_AMBULATORY_CARE_PROVIDER_SITE_OTHER): Payer: Self-pay | Admitting: OTOLARYNGOLOGY

## 2024-04-16 ENCOUNTER — Other Ambulatory Visit: Payer: Self-pay

## 2024-04-16 ENCOUNTER — Ambulatory Visit: Payer: Self-pay | Attending: OTOLARYNGOLOGY | Admitting: OTOLARYNGOLOGY

## 2024-04-16 VITALS — Ht 61.0 in | Wt 93.0 lb

## 2024-04-16 DIAGNOSIS — D0439 Carcinoma in situ of skin of other parts of face: Secondary | ICD-10-CM | POA: Insufficient documentation

## 2024-04-16 DIAGNOSIS — H6123 Impacted cerumen, bilateral: Secondary | ICD-10-CM | POA: Insufficient documentation

## 2024-04-16 DIAGNOSIS — Z9889 Other specified postprocedural states: Secondary | ICD-10-CM

## 2024-04-16 DIAGNOSIS — Z85828 Personal history of other malignant neoplasm of skin: Secondary | ICD-10-CM

## 2024-04-16 NOTE — H&P (Signed)
 ENT, [facility]  [address]  [place] [address]  Operated by [facility]  Return Patient Visit    Name: Laura Oconnell MRN:  [id number]   Date: 04/16/2024 DOB: [birth year] (74 y.o.)       Referring Provider:  Marin Neptune, PA-C    Reason for Visit:   Chief Complaint   Patient presents with    Follow Up 6 Months     6 month rc on skin and chronic cerumen impaction.       History of Present Illness:  Laura Oconnell is a 74 y.o. female who is s/p Mohs Recon right face BCCA 10/04/23. Doing well. No complaints. She is concerned for cerumen.        Patient History:  Problem List[1]    Current Outpatient Medications   Medication Sig    baclofen  (LIORESAL ) 10 mg Oral Tablet Take 1 Tablet (10 mg total) by mouth Three times a day as needed    Calcium-Cholecalciferol , D3, 600 mg-5 mcg (200 unit) Oral Capsule Take 1 Capsule by mouth Daily    citalopram (CELEXA) 40 mg Oral Tablet Take 1 Tablet (40 mg total) by mouth Daily    COPPER ORAL Take 2 mg by mouth Once a day    Diltiazem  HCl (TIAZAC ) 120 mg Oral Capsule,Sustained Action 24 hr Take 1 Capsule (120 mg total) by mouth Daily    ferrous sulfate  (FERATAB) 324 mg (65 mg iron ) Oral Tablet, Delayed Release (E.C.) Take 1 Tablet (324 mg total) by mouth Daily    nortriptyline (PAMELOR) 10 mg Oral Capsule Take 1 Capsule (10 mg total) by mouth Every night    rOPINIRole  (REQUIP ) 0.5 mg Oral Tablet Take 1 Tablet (0.5 mg total) by mouth Every night as needed    varenicline tartrate (CHANTIX) 0.5 mg Oral Tablet Take 1 Tablet (0.5 mg total) by mouth Daily      Allergies[2]  Past Medical History:   Diagnosis Date    Bronchiectasis with (acute) exacerbation (CMS HCC) 08/03/2021    Chronic obstructive pulmonary disease with acute exacerbation (CMS HCC) 09/18/2015    Chronic pain disorder 05/29/2015    Chronic, continuous use of opioids 08/03/2021    Closed right hip fracture, initial encounter (CMS HCC) 02/01/2021    Compression fracture 05/29/2015    Formatting of  this note might be different from the original. Per Dr. 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      Past Surgical History:   Procedure Laterality Date    CESAREAN SECTION      HX HYSTERECTOMY      PARTIAL GASTRECTOMY        Family Medical History:       Problem Relation (Age of Onset)  COPD Mother, Father            Social History[3]    Review of Systems:  Review of Systems    Physical Exam:  Ht 1.549 m (5' 1)   Wt 42.2 kg (93 lb)   BMI 17.57 kg/m       ENT Physical Exam  Constitutional  Appearance: patient appears well-developed, well-nourished and well-groomed,  Communication/Voice: communication appropriate for developmental age; vocal quality normal;  Head and Face  Appearance: head appears normal, face appears normal and face appears atraumatic;  Palpation: facial palpation normal;  Salivary: glands normal;  Head and Face comments: Right cheek sutures C, D, I  Ear  Hearing: intact;  Auricles: right auricle normal; left auricle normal;  External Mastoids: right external mastoid normal; left external mastoid normal;  Ear Canals: right ear canal normal; left ear canal normal;  Tympanic Membranes: right tympanic membrane normal; left tympanic membrane normal;  Nose  External Nose: nares patent  bilaterally; external nose normal;  Internal Nose: nasal mucosa normal; septum normal; bilateral inferior turbinates normal;  Oral Cavity/Oropharynx  Lips: normal;  Teeth: normal;  Gums: gingiva normal;  Tongue: normal;  Oral mucosa: normal;  Hard palate: normal;  Neck  Neck: neck normal; neck palpation normal;  Thyroid : thyroid  normal;  Respiratory  Inspection: breathing unlabored; normal breathing rate;  Lymphatic  Palpation: lymph nodes normal;  Neurovestibular  Mental Status: alert and oriented;  Psychiatric: mood normal; affect is appropriate;  Cranial Nerves: cranial nerves intact;         Assessment:  ENCOUNTER DIAGNOSES     ICD-10-CM   1. 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 Bilateral impacted cerumen  H61.23         Plan:  Medical records reviewed on 04/16/2024.  No new skin lesions.  Cerumen removed  Continue routine skin care and sunscreen.     Orders Placed This Encounter    [phone] - REMOVAL IMPACTED CERUMEN W/ INSTRUMENT, UNILATERAL (AMB ONLY-PD)      Return in about 6 months (around 10/14/2024).               [1]   Patient Active Problem List  Diagnosis    Bronchiectasis with (acute) exacerbation (CMS HCC)    COPD (chronic obstructive pulmonary disease)    Chronic pain disorder    Chronic, continuous use of opioids    Closed right hip fracture, initial encounter (CMS HCC)    Compression fracture    Depression    Difficulty with speech    Essential hypertension    GAD (generalized anxiety disorder)    Gastroesophageal reflux disease without esophagitis    Hypocalcemia    Hypoglycemia    Hyponatremia    Intracranial aneurysm    Myofascial pain syndrome    Neuropathic pain    Opioid use agreement exists    Osteoporosis    Other specified alveolar and parietoalveolar pneumonopathies    Spasm of back muscles    Spondylosis of thoracic region without myelopathy or radiculopathy    Squamous cell carcinoma in  situ (SCCIS) of skin of forehead    Acute postoperative anemia due to expected blood loss    Blood loss anemia    Encounter for rehabilitation    Restless leg syndrome    Acute respiratory failure due to COVID-19 (CMS HCC)    Squamous  cell carcinoma in situ (SCCIS) of skin of cheek   [2] No Known Allergies  [3]   Social History  Tobacco Use    Smoking status: Former     Current packs/day: 0.00     Average packs/day: 2.0 packs/day for 42.0 years (84.0 ttl pk-yrs)     Types: Cigarettes     Start date: 96     Quit date: 2008     Years since quitting: 18.0    Smokeless tobacco: Never   Vaping Use    Vaping status: Never Used   Substance Use Topics    Drug use: Never

## 2024-04-16 NOTE — Procedures (Signed)
 [facility]  [address]  [place] [address]  Operated by [facility]  Procedure Note    Name: Laura Oconnell MRN:  [id number]   Date: 04/16/2024 DOB:  Nov 12, 1950 (74 y.o.)         [phone] - REMOVAL IMPACTED CERUMEN W/ INSTRUMENT, UNILATERAL (AMB ONLY-PD)    Performed by: Margean Anes, DO  Authorized by: Margean Anes, DO    Time Out:     Immediately before the procedure, a time out was called:  Yes    Patient verified:  Yes    Procedure Verified:  Yes    Site Verified:  Yes  Documentation:      Procedure: Cerumen cleaning  Pre-op Dx: Cerumen impaction      Bilateral EAC(s) examined under binocular microscopy.  Cerumen and/or debris was cleaned from the canal(s) using curettes, suction, and alligator forceps.  Patient tolerated procedure well.       Anes Margean, DO

## 2024-10-14 ENCOUNTER — Ambulatory Visit (INDEPENDENT_AMBULATORY_CARE_PROVIDER_SITE_OTHER): Payer: Self-pay | Admitting: OTOLARYNGOLOGY
